# Patient Record
Sex: Male | Born: 1975 | Race: Asian | Hispanic: No | Marital: Married | Smoking: Never smoker
Health system: Southern US, Community
[De-identification: ages and names within clinical notes are randomized; demographics above are authoritative.]

## PROBLEM LIST (undated history)

## (undated) DIAGNOSIS — I10 Essential (primary) hypertension: Secondary | ICD-10-CM

## (undated) HISTORY — DX: Essential (primary) hypertension: I10

---

## 2012-06-24 ENCOUNTER — Ambulatory Visit: Payer: BC Managed Care – PPO

## 2012-06-24 ENCOUNTER — Ambulatory Visit (INDEPENDENT_AMBULATORY_CARE_PROVIDER_SITE_OTHER): Payer: BC Managed Care – PPO | Admitting: Emergency Medicine

## 2012-06-24 ENCOUNTER — Telehealth: Payer: Self-pay | Admitting: Radiology

## 2012-06-24 VITALS — BP 162/110 | HR 90 | Temp 98.4°F | Resp 17 | Ht 66.0 in | Wt 166.0 lb

## 2012-06-24 DIAGNOSIS — R042 Hemoptysis: Secondary | ICD-10-CM

## 2012-06-24 DIAGNOSIS — R05 Cough: Secondary | ICD-10-CM

## 2012-06-24 DIAGNOSIS — I10 Essential (primary) hypertension: Secondary | ICD-10-CM

## 2012-06-24 DIAGNOSIS — R918 Other nonspecific abnormal finding of lung field: Secondary | ICD-10-CM

## 2012-06-24 MED ORDER — LISINOPRIL 20 MG PO TABS: 20.0000 mg | ORAL_TABLET | Freq: Every day | ORAL | Status: DC

## 2012-06-24 NOTE — Patient Instructions (Addendum)
Hemoptysis  Hemoptysis means coughing up blood from some part of the respiratory tract. The respiratory tract includes the nose, mouth, throat, airway passages, and lungs. You should always contact a caregiver if you develop hemoptysis. This is important as even mild cases of hemoptysis may lead to serious breathing or bleeding problems. Major bleeding from the airway is considered a medical emergency, and needs to be evaluated and managed promptly to avoid complications, disability, or death. Hemoptysis may reoccur from time to time.  CAUSES   In some cases, the cause of hemoptysis is not known. Causes may include:   A ruptured blood vessel caused by coughing or an infection.   Bronchiectasis. Bronchiectasis is a long-standing infection of the small air passageways.   A blood clot in the lungs (pulmonary embolism).   Pneumonia.   Tuberculosis.   Breathing in a small foreign object.   Cancer.  DIAGNOSIS   Diagnosing the cause of hemoptysis is important. The most common cause of hemoptysis is a ruptured blood vessel caused by coughing or a mild infection. This is normally no cause for concern. Diagnosing the cause for hemoptysis is based on your history, symptoms, physical examination, and diagnostic tests. Tests for diagnosing the cause of hemoptysis may include:   Blood samples.   A chest X-ray.   A computerized X-ray scan (CT scan or CAT scan).   Bronchoscopy. This test uses a flexible tube (a bronchoscope) to see inside the lungs.   Pulmonary angiography. Pulmonary angiography is an X-ray procedure that looks at the vessels in the lungs. Angiography produces a picture called an angiogram. It requires injecting a dye into the blood vessels.  TREATMENT    Treatment for hemoptysis depends on the cause. It also depends on the quantity of blood. Infrequent, mild hemoptysis usually does not require specific, immediate treatment.   If the cause of hemoptysis is unknown, treatment may involve monitoring for  at least 2 or 3 years. If you have a normal chest X-ray and bronchoscopy, the hemoptysis usually clears within 6 months.   Treatment of a pneumonia, chronic bronchiectasis, or tuberculosis usually involves medicines to fight the infections.   For lung cancers, treatment depends on the stage of the cancer.   Major bleeding is a medical emergency. Steps are usually taken to find the source and stop the bleeding.   Some methods of controlling active moderate to severe bleeding include:   Surgical removal (resection). Tissue causing the hemoptysis is removed.   Bronchoscopic laser therapy. During a bronchoscopy, a laser is used to remove tumors and lesions or widen airways.   Bronchial artery embolization. This procedure involves injecting substances into the bloodstream to stop blood flow.  SEEK IMMEDIATE MEDICAL CARE IF:    You begin to cough up large amounts of blood.   You develop problems with your breathing.   You begin vomiting blood or see blood in your stool.   Develop chest pain.   Feel faint or pass out.   You develop a fever over 102 F (38.9 C), or as your caregiver suggests.  Document Released: 04/03/2001 Document Revised: 04/17/2011 Document Reviewed: 06/08/2009  [employer] Patient Information 2013 [employer].

## 2012-06-24 NOTE — Telephone Encounter (Signed)
Patient will need CT scan, called to advise. Spoke to sister, she will let him know.

## 2012-06-24 NOTE — Addendum Note (Signed)
Addended by: Carmelina Dane on: 06/24/2012 03:00 PM   Modules accepted: Orders

## 2012-06-24 NOTE — Progress Notes (Signed)
[facility] [address], [place] [address] [phone]  Date:  06/24/2012   Name:  Andre Webb   DOB:  [birth year]   MRN:  [id number]  PCP:  No primary provider on file.    Chief Complaint: Hemoptysis and Chest Pain   History of Present Illness:  Andre Webb is a 37 y.o. very pleasant male patient who presents with the following:  Had coughing spell this morning associated with some hard coughing and some hemoptysis.  No history of shortness of breath or wheezing. No coryza, nausea or vomiting.  No stool change or fever or chills. Treated while a child for months with a daily medication for "lung problem" that he thinks may have been tuberculosis.  Says that he has a radiographic scar since.  No improvement with over the counter medications or other home remedies. Denies other complaint or health concern today.   There are no active problems to display for this patient.   History reviewed. No pertinent past medical history.  History reviewed. No pertinent past surgical history.  History  Substance Use Topics  . Smoking status: Never Smoker   . Smokeless tobacco: Not on file  . Alcohol Use: No    History reviewed. No pertinent family history.  No Known Allergies  Medication list has been reviewed and updated.  No current outpatient prescriptions on file prior to visit.   No current facility-administered medications on file prior to visit.    Review of Systems:  As per HPI, otherwise negative.    Physical Examination: Filed Vitals:   06/24/12 1116  BP: 162/110  Pulse: 90  Temp: 98.4 F (36.9 C)  Resp: 17   Filed Vitals:   06/24/12 1116  Height: 5\' 6"  (1.676 m)  Weight: 166 lb (75.297 kg)   Body mass index is 26.81 kg/(m^2). Ideal Body Weight: Weight in (lb) to have BMI = 25: 154.6  GEN: WDWN, NAD, Non-toxic, A & O x 3 HEENT: Atraumatic, Normocephalic. Neck supple. No masses, No LAD. Ears and Nose: No external  deformity. CV: RRR, No M/G/R. No JVD. No thrill. No extra heart sounds. PULM: CTA B, no wheezes, crackles, rhonchi. No retractions. No resp. distress. No accessory muscle use. ABD: S, NT, ND, +BS. No rebound. No HSM. EXTR: No c/c/e NEURO Normal gait.  PSYCH: Normally interactive. Conversant. Not depressed or anxious appearing.  Calm demeanor.    Assessment and Plan: History of treated TB Cough and hemoptysis Await radiologist report   Signed,  Phillips Odor, MD   [facility] reading (PRIMARY) by  Dr. Dareen Piano.  negative.

## 2012-06-26 ENCOUNTER — Other Ambulatory Visit: Payer: Self-pay | Admitting: Physician Assistant

## 2012-06-26 ENCOUNTER — Encounter (HOSPITAL_COMMUNITY): Payer: Self-pay

## 2012-06-26 ENCOUNTER — Other Ambulatory Visit: Payer: Self-pay | Admitting: Emergency Medicine

## 2012-06-26 ENCOUNTER — Telehealth: Payer: Self-pay

## 2012-06-26 ENCOUNTER — Ambulatory Visit (HOSPITAL_COMMUNITY)
Admission: RE | Admit: 2012-06-26 | Discharge: 2012-06-26 | Disposition: A | Discharge status: Home / Self Care | Payer: BC Managed Care – PPO | Source: Ambulatory Visit | Attending: Emergency Medicine | Admitting: Emergency Medicine

## 2012-06-26 DIAGNOSIS — R042 Hemoptysis: Secondary | ICD-10-CM

## 2012-06-26 DIAGNOSIS — R918 Other nonspecific abnormal finding of lung field: Secondary | ICD-10-CM

## 2012-06-26 DIAGNOSIS — R911 Solitary pulmonary nodule: Secondary | ICD-10-CM

## 2012-06-26 DIAGNOSIS — R9389 Abnormal findings on diagnostic imaging of other specified body structures: Secondary | ICD-10-CM | POA: Insufficient documentation

## 2012-06-26 DIAGNOSIS — J8410 Pulmonary fibrosis, unspecified: Secondary | ICD-10-CM | POA: Insufficient documentation

## 2012-06-26 MED ORDER — IOHEXOL 300 MG/ML  SOLN
80.0000 mL | Freq: Once | INTRAMUSCULAR | Status: AC | PRN
  Administered 2012-06-26: 80 mL via INTRAVENOUS

## 2012-06-26 NOTE — Telephone Encounter (Signed)
[facility] called and advised that pt is there to have CT and w/pt's Sxs, the radiologist states that the best test to do is CT Chest w/contrast and they need [place] to change order in system if in agreement. Checked w/Heather who authorized to change per Radiologist advice. Dr Dareen Piano, sending this to you  FYI.

## 2012-07-02 ENCOUNTER — Telehealth: Payer: Self-pay

## 2012-07-02 NOTE — Telephone Encounter (Signed)
Pt is wanting to know results of CT Scan

## 2012-07-02 NOTE — Telephone Encounter (Signed)
Scarring right upper lobe, negative otherwise. Patient advised.

## 2012-12-04 ENCOUNTER — Ambulatory Visit (INDEPENDENT_AMBULATORY_CARE_PROVIDER_SITE_OTHER): Payer: BC Managed Care – PPO | Admitting: Family Medicine

## 2012-12-04 VITALS — BP 182/118 | HR 82 | Temp 98.9°F | Resp 16 | Ht 66.0 in | Wt 167.0 lb

## 2012-12-04 DIAGNOSIS — I10 Essential (primary) hypertension: Secondary | ICD-10-CM

## 2012-12-04 DIAGNOSIS — R079 Chest pain, unspecified: Secondary | ICD-10-CM

## 2012-12-04 LAB — POCT URINALYSIS DIPSTICK
Bilirubin, UA: NEGATIVE
Glucose, UA: NEGATIVE
Leukocytes, UA: NEGATIVE
Nitrite, UA: NEGATIVE
Urobilinogen, UA: 0.2

## 2012-12-04 LAB — POCT CBC
Granulocyte percent: 63.6 %G (ref 37–80)
HCT, POC: 48.1 % (ref 43.5–53.7)
Hemoglobin: 15.6 g/dL (ref 14.1–18.1)
Lymph, poc: 1.7 (ref 0.6–3.4)
MCHC: 32.4 g/dL (ref 31.8–35.4)
MCV: 94.1 fL (ref 80–97)
POC LYMPH PERCENT: 28.8 %L (ref 10–50)
RDW, POC: 13.3 %

## 2012-12-04 MED ORDER — METOPROLOL SUCCINATE ER 25 MG PO TB24: 25.0000 mg | ORAL_TABLET | Freq: Every day | ORAL | Status: DC

## 2012-12-04 MED ORDER — ASPIRIN EC 81 MG PO TBEC: 81.0000 mg | DELAYED_RELEASE_TABLET | Freq: Every day | ORAL | Status: AC

## 2012-12-04 MED ORDER — LISINOPRIL-HYDROCHLOROTHIAZIDE 20-25 MG PO TABS: 1.0000 | ORAL_TABLET | Freq: Every day | ORAL | Status: DC

## 2012-12-04 NOTE — Patient Instructions (Signed)

## 2012-12-04 NOTE — Progress Notes (Addendum)
This chart was scribed for Norberto Sorenson, MD by Andre Webb, ED Scribe. This patient was seen in room Room 8 and the patient's care was started at 4:53 PM  Subjective:    Patient ID: Andre Webb, male    DOB: 1975-07-01, 37 y.o.   MRN: [id number] Chief Complaint  Patient presents with   Hypertension    BP screen today was extremely high   Chest Pain    pressure, 1 day   Cough     HPI Andre Webb is a 37 y.o. male who presents to the [facility] complaining of HTN. Pt states for the past few weeks he has been having episodes of chest pain, described as tightness, that does not radiate, and occ accompanied by a vertigo-like dizziness or posterior HA. Often these symptoms last from about 15 min to a few hours. Pt was at a health fair at work today and states it was noted that his BP was markedly high so he was instructed to go see his dr immediately. Pt states the last time he had chest pain was 3 days ago - no CP currently. He had been attributing the CP to heavy lifting while at work. Pt states when he was seen here last for an acute illness w/ hemoptysis by Dr. Dareen Piano he was noted to have HTN so started on lisnopril but he only took it for 10 days. He stopped because he was feeling fine so didn't think he needed it. Pt denies diaphoresis and dyspnea, denies imbalance, denies vision changes. Pt is not fasting - last ate about 5 hrs prev.  Pt states when he is outside cutting the lawn, he generally feels chest tightness.  History reviewed. No pertinent past medical history.  Current Outpatient Prescriptions on File Prior to Visit  Medication Sig Dispense Refill   lisinopril (PRINIVIL,ZESTRIL) 20 MG tablet Take 1 tablet (20 mg total) by mouth daily.  30 tablet  3   No current facility-administered medications on file prior to visit.   No Known Allergies   Review of Systems  Constitutional: Negative for fever and chills.  Eyes: Negative for visual disturbance.  Respiratory:  Positive for chest tightness. Negative for apnea, cough and shortness of breath.   Cardiovascular: Positive for chest pain. Negative for leg swelling.  Gastrointestinal: Negative for abdominal pain.  Genitourinary: Negative for urgency, decreased urine volume and difficulty urinating.  Musculoskeletal: Negative for arthralgias, back pain and myalgias.  Skin: Negative for rash.  Neurological: Positive for dizziness, light-headedness and headaches. Negative for syncope, facial asymmetry and weakness.  Psychiatric/Behavioral: The patient is not nervous/anxious.     Triage Vitals: BP 182/118   Pulse 82   Temp(Src) 98.9 F (37.2 C)   Resp 16   Ht 5\' 6"  (1.676 m)   Wt 167 lb (75.751 kg)   BMI 26.97 kg/m2   SpO2 97% Objective:   Physical Exam  Nursing note and vitals reviewed. Constitutional: He is oriented to person, place, and time. He appears well-developed and well-nourished. No distress.  HENT:  Head: Normocephalic and atraumatic.  Right Ear: External ear normal.  Left Ear: External ear normal.  Eyes: Conjunctivae are normal. Pupils are equal, round, and reactive to light.  Neck: Normal range of motion. Neck supple. No tracheal deviation present. No thyromegaly present.  Cardiovascular: Normal rate, regular rhythm and normal heart sounds.  Exam reveals no gallop and no friction rub.   No murmur heard. Pulmonary/Chest: Effort normal and breath sounds normal. No respiratory  distress. He has no wheezes. He has no rales. He exhibits no tenderness.  Musculoskeletal: He exhibits no edema and no tenderness.  Lymphadenopathy:    He has no cervical adenopathy.  Neurological: He is alert and oriented to person, place, and time. No cranial nerve deficit. He exhibits normal muscle tone.  Skin: Skin is warm and dry. He is not diaphoretic.  Psychiatric: He has a normal mood and affect. His behavior is normal.    EKG: NSR, large T waves, T wave inversion in lead 3 and v1   5:41 PM-Discussed EKG  findings with pt.     Assessment & Plan:  Will refer pt to Cardiologist. Reccommended pt to start an Aspirin daily along with started prescribed anti-hypertensives. Advised pt if he has chest pain, to take 4 baby asa and if it persists beyond 5 minutes, call 911. Recheck in clinic in 4d.   Chest pain, unspecified - Plan: EKG 12-Lead, Ambulatory referral to Cardiology, POCT urinalysis dipstick, POCT CBC, TSH, Comprehensive metabolic panel, LDL cholesterol, direct, Lipid panel  Essential hypertension, malignant  Meds ordered this encounter  Medications   lisinopril-hydrochlorothiazide (PRINZIDE,ZESTORETIC) 20-25 MG per tablet    Sig: Take 1 tablet by mouth daily.    Dispense:  30 tablet    Refill:  0   metoprolol succinate (TOPROL-XL) 25 MG 24 hr tablet    Sig: Take 1 tablet (25 mg total) by mouth daily.    Dispense:  30 tablet    Refill:  0   aspirin EC 81 MG tablet    Sig: Take 1 tablet (81 mg total) by mouth daily.    Dispense:  90 tablet    Refill:  3    I personally performed the services described in this documentation, which was scribed in my presence. The recorded information has been reviewed and considered, and addended by me as needed.  Norberto Sorenson, MD MPH

## 2012-12-05 LAB — LDL CHOLESTEROL, DIRECT: Direct LDL: 107 mg/dL — ABNORMAL HIGH

## 2012-12-05 LAB — COMPREHENSIVE METABOLIC PANEL
ALT: 26 U/L (ref 0–53)
CO2: 29 mEq/L (ref 19–32)
Calcium: 9.9 mg/dL (ref 8.4–10.5)
Chloride: 102 mEq/L (ref 96–112)
Glucose, Bld: 91 mg/dL (ref 70–99)
Sodium: 139 mEq/L (ref 135–145)
Total Bilirubin: 0.6 mg/dL (ref 0.3–1.2)
Total Protein: 7.4 g/dL (ref 6.0–8.3)

## 2012-12-05 LAB — LIPID PANEL
Cholesterol: 155 mg/dL (ref 0–200)
Total CHOL/HDL Ratio: 4.3 Ratio

## 2012-12-08 ENCOUNTER — Ambulatory Visit (INDEPENDENT_AMBULATORY_CARE_PROVIDER_SITE_OTHER): Payer: BC Managed Care – PPO | Admitting: Family Medicine

## 2012-12-08 VITALS — BP 141/95 | HR 76 | Temp 98.0°F | Resp 17 | Ht 67.0 in | Wt 168.0 lb

## 2012-12-08 DIAGNOSIS — Z79899 Other long term (current) drug therapy: Secondary | ICD-10-CM

## 2012-12-08 DIAGNOSIS — I10 Essential (primary) hypertension: Secondary | ICD-10-CM

## 2012-12-08 LAB — BASIC METABOLIC PANEL
BUN: 17 mg/dL (ref 6–23)
CO2: 30 mEq/L (ref 19–32)
Chloride: 99 mEq/L (ref 96–112)
Glucose, Bld: 102 mg/dL — ABNORMAL HIGH (ref 70–99)
Potassium: 3.9 mEq/L (ref 3.5–5.3)
Sodium: 136 mEq/L (ref 135–145)

## 2012-12-08 MED ORDER — LOSARTAN POTASSIUM-HCTZ 100-25 MG PO TABS: 1.0000 | ORAL_TABLET | Freq: Every day | ORAL | Status: DC

## 2012-12-08 MED ORDER — METOPROLOL SUCCINATE ER 50 MG PO TB24: 50.0000 mg | ORAL_TABLET | Freq: Every day | ORAL | Status: DC

## 2012-12-08 MED ORDER — AMLODIPINE BESYLATE 5 MG PO TABS: 5.0000 mg | ORAL_TABLET | Freq: Every day | ORAL | Status: DC

## 2012-12-08 NOTE — Patient Instructions (Signed)
We will double your metoprolol - a higher dose has been sent to your pharmacy but for now just take 2 tabs of your Toprol XL 25mg  until you run out - then when you get the next rx from your pharmacy it will be at 50mg  instead. We will change your lisinopril-hctz to losartan-hctz as hopefully it will not cause the dry cough you are experiencing. If you want to complete your current bottle of lisinopril-hctz before switching to the losartan, that is fine. We will start you on amlodipine 5mg  daily. Lets recheck your blood pressure in clinic in about 1 month or you can schedule an appointment to see me on a Friday at our [facility] clinic next door - just call [phone] to schedule.  Come back sooner if you are having any symptoms - headaches, lightheadedness, vision changes, chest pain, swelling in your legs, shortness of breath, or anything else new or concerning.  Managing Your High Blood Pressure Blood pressure is a measurement of how forceful your blood is pressing against the walls of the arteries. Arteries are muscular tubes within the circulatory system. Blood pressure does not stay the same. Blood pressure rises when you are active, excited, or nervous; and it lowers during sleep and relaxation. If the numbers measuring your blood pressure stay above normal most of the time, you are at risk for health problems. High blood pressure (hypertension) is a long-term (chronic) condition in which blood pressure is elevated. A blood pressure reading is recorded as two numbers, such as 120 over 80 (or 120/80). The first, higher number is called the systolic pressure. It is a measure of the pressure in your arteries as the heart beats. The second, lower number is called the diastolic pressure. It is a measure of the pressure in your arteries as the heart relaxes between beats.  Keeping your blood pressure in a normal range is important to your overall health and prevention of health problems, such as heart disease and  stroke. When your blood pressure is uncontrolled, your heart has to work harder than normal. High blood pressure is a very common condition in adults because blood pressure tends to rise with age. Men and women are equally likely to have hypertension but at different times in life. Before age 9, men are more likely to have hypertension. After 39 years of age, women are more likely to have it. Hypertension is especially common in African Americans. This condition often has no signs or symptoms. The cause of the condition is usually not known. Your caregiver can help you come up with a plan to keep your blood pressure in a normal, healthy range. BLOOD PRESSURE STAGES Blood pressure is classified into four stages: normal, prehypertension, stage 1, and stage 2. Your blood pressure reading will be used to determine what type of treatment, if any, is necessary. Appropriate treatment options are tied to these four stages:  Normal  Systolic pressure (mm Hg): below 120.  Diastolic pressure (mm Hg): below 80. Prehypertension  Systolic pressure (mm Hg): 120 to 139.  Diastolic pressure (mm Hg): 80 to 89. Stage1  Systolic pressure (mm Hg): 140 to 159.  Diastolic pressure (mm Hg): 90 to 99. Stage2  Systolic pressure (mm Hg): 160 or above.  Diastolic pressure (mm Hg): 100 or above. RISKS RELATED TO HIGH BLOOD PRESSURE Managing your blood pressure is an important responsibility. Uncontrolled high blood pressure can lead to:  A heart attack.  A stroke.  A weakened blood vessel (aneurysm).  Heart  failure.  Kidney damage.  Eye damage.  Metabolic syndrome.  Memory and concentration problems. HOW TO MANAGE YOUR BLOOD PRESSURE Blood pressure can be managed effectively with lifestyle changes and medicines (if needed). Your caregiver will help you come up with a plan to bring your blood pressure within a normal range. Your plan should include the following: Education  Read all information  provided by your caregivers about how to control blood pressure.  Educate yourself on the latest guidelines and treatment recommendations. New research is always being done to further define the risks and treatments for high blood pressure. Lifestylechanges  Control your weight.  Avoid smoking.  Stay physically active.  Reduce the amount of salt in your diet.  Reduce stress.  Control any chronic conditions, such as high cholesterol or diabetes.  Reduce your alcohol intake. Medicines  Several medicines (antihypertensive medicines) are available, if needed, to bring blood pressure within a normal range. Communication  Review all the medicines you take with your caregiver because there may be side effects or interactions.  Talk with your caregiver about your diet, exercise habits, and other lifestyle factors that may be contributing to high blood pressure.  See your caregiver regularly. Your caregiver can help you create and adjust your plan for managing high blood pressure. RECOMMENDATIONS FOR TREATMENT AND FOLLOW-UP  The following recommendations are based on current guidelines for managing high blood pressure in nonpregnant adults. Use these recommendations to identify the proper follow-up period or treatment option based on your blood pressure reading. You can discuss these options with your caregiver.  Systolic pressure of 120 to 139 or diastolic pressure of 80 to 89: Follow up with your caregiver as directed.  Systolic pressure of 140 to 160 or diastolic pressure of 90 to 100: Follow up with your caregiver within 2 months.  Systolic pressure above 160 or diastolic pressure above 100: Follow up with your caregiver within 1 month.  Systolic pressure above 180 or diastolic pressure above 110: Consider antihypertensive therapy; follow up with your caregiver within 1 week.  Systolic pressure above 200 or diastolic pressure above 120: Begin antihypertensive therapy; follow up  with your caregiver within 1 week. Document Released: 10/18/2011 Document Reviewed: 10/18/2011 [facility] Patient Information 2014 [place], [place].

## 2012-12-08 NOTE — Progress Notes (Signed)
This chart was scribed for Norberto Sorenson, MD by Ardelia Mems, Scribe. This patient was seen in room 4 and the patient's care was started at 12:10 PM.  Subjective:    Patient ID: Andre Webb, male    DOB: September 07, 1975, 37 y.o.   MRN: [id number]  Chief Complaint  Patient presents with  . Follow-up    HBP    HPI  HPI Comments: Andre Webb is a 37 y.o. male who presents to [facility] for a follow-up on his BP. Pt was seen here 3 days ago after having episodes of chest pain, described as "pressure" and high blood pressure noted at a health screen. He was started on toprol xl 25, lisinopril-hctz 20-25, and asa 81 qd. He states that his chest pain has improved since his last visit and that he has only had a few rare mild episodes in the past 3 days. He states that he is having intermittent momentary light-headedness today. He states that this sensation is not a "room spinning" sensation. He states that episodes of dizziness usually last only a few seconds, but sometimes longer and seems to be asstd w/ position change though hard to say. He states that he has had a mild cough at night in the past 3 days. He states that he has been drinking plenty of water. He states that he is taking all of the medications he was started on after his last visit without any concerning side effects, other than possibly his cough. He denies visual disturbances leg swelling, difficulty breathing or muscle cramps.  History reviewed. No pertinent past medical history.  Current Outpatient Prescriptions on File Prior to Visit  Medication Sig Dispense Refill  . aspirin EC 81 MG tablet Take 1 tablet (81 mg total) by mouth daily.  90 tablet  3   No current facility-administered medications on file prior to visit.   No Known Allergies   Review of Systems  Constitutional: Negative for fever and chills.  Eyes: Negative for visual disturbance.  Respiratory: Positive for cough. Negative for shortness of breath  and wheezing.   Cardiovascular: Positive for chest pain (improving). Negative for leg swelling.  Neurological: Positive for dizziness and light-headedness. Negative for syncope, facial asymmetry, weakness and headaches.      BP 141/95  Pulse 76  Temp(Src) 98 F (36.7 C) (Oral)  Resp 17  Ht 5\' 7"  (1.702 m)  Wt 168 lb (76.204 kg)  BMI 26.31 kg/m2  SpO2 96% Objective:   Physical Exam  Nursing note and vitals reviewed. Constitutional: He is oriented to person, place, and time. He appears well-developed and well-nourished. No distress.  HENT:  Head: Normocephalic and atraumatic.  Eyes: EOM are normal.  Neck: Neck supple. No tracheal deviation present.  Cardiovascular: Normal rate, regular rhythm and normal heart sounds.   No murmur heard. Pulmonary/Chest: Effort normal and breath sounds normal. No respiratory distress. He has no wheezes. He has no rales.  Musculoskeletal: Normal range of motion.  Neurological: He is alert and oriented to person, place, and time.  Skin: Skin is warm and dry.  Psychiatric: He has a normal mood and affect. His behavior is normal.      orthostatics negative: BP 132-141/89-97 with P 72-76 Assessment & Plan:   Essential hypertension, malignant  Encounter for long-term (current) use of other medications - Plan: Basic metabolic panel Increased toprol from 25 to 50. Add in amlodipine 5 now.  After current bottle of lisinopril-hctz 20-25 is finished (  4 more wks) then switch to losartan-hctz 100-25 due to cough - unless cough is bothering him more, than can switch sooner.  Recheck in 1 mo - sched appt w/ me on 12/5. Will f/u on cardiology referral for poss stress test. Meds ordered this encounter  Medications  . metoprolol succinate (TOPROL-XL) 50 MG 24 hr tablet    Sig: Take 1 tablet (50 mg total) by mouth daily.    Dispense:  30 tablet    Refill:  0  . losartan-hydrochlorothiazide (HYZAAR) 100-25 MG per tablet    Sig: Take 1 tablet by mouth daily.     Dispense:  30 tablet    Refill:  0  . amLODipine (NORVASC) 5 MG tablet    Sig: Take 1 tablet (5 mg total) by mouth daily.    Dispense:  30 tablet    Refill:  1    I personally performed the services described in this documentation, which was scribed in my presence. The recorded information has been reviewed and considered, and addended by me as needed.  Norberto Sorenson, MD MPH

## 2012-12-09 ENCOUNTER — Encounter: Payer: Self-pay | Admitting: Family Medicine

## 2012-12-20 ENCOUNTER — Encounter: Payer: Self-pay | Admitting: Cardiovascular Disease

## 2012-12-20 ENCOUNTER — Ambulatory Visit (INDEPENDENT_AMBULATORY_CARE_PROVIDER_SITE_OTHER): Payer: BC Managed Care – PPO | Admitting: Cardiovascular Disease

## 2012-12-20 VITALS — BP 158/78 | HR 84 | Resp 20 | Ht 66.0 in | Wt 169.0 lb

## 2012-12-20 DIAGNOSIS — I10 Essential (primary) hypertension: Secondary | ICD-10-CM

## 2012-12-20 DIAGNOSIS — R0789 Other chest pain: Secondary | ICD-10-CM

## 2012-12-20 NOTE — Patient Instructions (Signed)
Your physician recommends that you schedule a follow-up appointment in: as needed  

## 2012-12-22 DIAGNOSIS — I10 Essential (primary) hypertension: Secondary | ICD-10-CM | POA: Insufficient documentation

## 2012-12-22 NOTE — Progress Notes (Signed)
Patient ID: Andre Webb, male   DOB: December 18, 1975, 37 y.o.   MRN: [id number]     Reason for office visit [name] was diagnosed with asymptomatic systemic hypertension and a recent employer mandated exam. One point he complained of a little chest tightness but this was never severe. He has a fairly physical job and is able to do it without any limitations. His and hypertensive medications have been slowly titrated and his blood pressure control is now greatly improved. He is monitoring his blood pressure at home and a typical reading will be 116-132/70s. Today when he first checked and his blood pressure is 150/78 when I rechecked it was 130/84. He is now completely asymptomatic. He was referred to cardiology since he needs so many antihypertensive medications.   Allergies  Allergen Reactions  . Lisinopril     Cough     Current Outpatient Prescriptions  Medication Sig Dispense Refill  . amLODipine (NORVASC) 5 MG tablet Take 1 tablet (5 mg total) by mouth daily.  30 tablet  1  . aspirin EC 81 MG tablet Take 1 tablet (81 mg total) by mouth daily.  90 tablet  3  . losartan-hydrochlorothiazide (HYZAAR) 100-25 MG per tablet Take 1 tablet by mouth daily.  30 tablet  0  . metoprolol succinate (TOPROL-XL) 50 MG 24 hr tablet Take 1 tablet (50 mg total) by mouth daily.  30 tablet  0   No current facility-administered medications for this visit.    No past medical probelms.  No past surgery.  No family history of attention or other inheritable disorders.  History   Social History  . Marital Status: Married    Spouse Name: N/A    Number of Children: N/A  . Years of Education: N/A   Occupational History  . Not on file.   Social History Main Topics  . Smoking status: Never Smoker   . Smokeless tobacco: Not on file  . Alcohol Use: No  . Drug Use: No  . Sexual Activity: No   Other Topics Concern  . Not on file   Social History Narrative  . No narrative on file    Review of  systems: The patient specifically denies any chest pain at rest or with exertion, dyspnea at rest or with exertion, orthopnea, paroxysmal nocturnal dyspnea, syncope, palpitations, focal neurological deficits, intermittent claudication, lower extremity edema, unexplained weight gain, cough, hemoptysis or wheezing.  The patient also denies abdominal pain, nausea, vomiting, dysphagia, diarrhea, constipation, polyuria, polydipsia, dysuria, hematuria, frequency, urgency, abnormal bleeding or bruising, fever, chills, unexpected weight changes, mood swings, change in skin or hair texture, change in voice quality, auditory or visual problems, allergic reactions or rashes, new musculoskeletal complaints other than usual "aches and pains".   PHYSICAL EXAM BP 158/78  Pulse 84  Resp 20  Ht 5\' 6"  (1.676 m)  Wt 169 lb (76.658 kg)  BMI 27.29 kg/m2  General: Alert, oriented x3, no distress Head: no evidence of trauma, PERRL, EOMI, no exophtalmos or lid lag, no myxedema, no xanthelasma; normal ears, nose and oropharynx Neck: normal jugular venous pulsations and no hepatojugular reflux; brisk carotid pulses without delay and no carotid bruits Chest: clear to auscultation, no signs of consolidation by percussion or palpation, normal fremitus, symmetrical and full respiratory excursions Cardiovascular: normal position and quality of the apical impulse, regular rhythm, normal first and second heart sounds, no murmurs, rubs or gallops Abdomen: no tenderness or distention, no masses by palpation, no abnormal pulsatility or arterial  bruits, normal bowel sounds, no hepatosplenomegaly Extremities: no clubbing, cyanosis or edema; 2+ radial, ulnar and brachial pulses bilaterally; 2+ right femoral, posterior tibial and dorsalis pedis pulses; 2+ left femoral, posterior tibial and dorsalis pedis pulses; no subclavian or femoral bruits There is no evidence of radial femoral delay Neurological: grossly nonfocal   EKG:  NSR  Lipid Panel     Component Value Date/Time   CHOL 155 12/04/2012 1733   TRIG 178* 12/04/2012 1733   HDL 36* 12/04/2012 1733   CHOLHDL 4.3 12/04/2012 1733   VLDL 36 12/04/2012 1733   LDLCALC 83 12/04/2012 1733    BMET    Component Value Date/Time   NA 136 12/08/2012 1227   K 3.9 12/08/2012 1227   CL 99 12/08/2012 1227   CO2 30 12/08/2012 1227   GLUCOSE 102* 12/08/2012 1227   BUN 17 12/08/2012 1227   CREATININE 0.80 12/08/2012 1227   CALCIUM 10.1 12/08/2012 1227     ASSESSMENT AND PLAN Although [name] requires multiple antihypertensive medications and does not have a family history of hypertension, the most likely explanation for his high blood pressure remains essential hypertension. I think his physicians have done an excellent job of finding a successful and the rational regimen of antihypertensive medications. He is tolerating these without any side effects. I do not think any adjustment is necessary. A history and physical exam there does not appear to be any reason to suspect a secondary cause of hypertension. He has normal renal function. He is at low risk of developing renal artery stenosis. There is no evidence of congenital cardiac or vascular abnormalities exam. His electrocardiogram does not show evidence of significant left ventricular hypertrophy and if he continues to take appropriate hypertensive medications his outcome is likely to be good. It is also important that he maintain healthy weight, restrict sodium in his diet and remain physically active. Orders Placed This Encounter  Procedures  . EKG 12-Lead   Patient Instructions  Your physician recommends that you schedule a follow-up appointment in: as needed      [facility]  Thurmon Fair, MD, [facility] [phone] office [phone] pager

## 2012-12-24 ENCOUNTER — Encounter: Payer: Self-pay | Admitting: Cardiovascular Disease

## 2013-01-06 ENCOUNTER — Other Ambulatory Visit: Payer: Self-pay | Admitting: Family Medicine

## 2013-01-10 ENCOUNTER — Encounter: Payer: Self-pay | Admitting: Family Medicine

## 2013-01-10 ENCOUNTER — Ambulatory Visit (INDEPENDENT_AMBULATORY_CARE_PROVIDER_SITE_OTHER): Payer: BC Managed Care – PPO | Admitting: Family Medicine

## 2013-01-10 VITALS — BP 100/64 | HR 62 | Temp 99.1°F | Resp 16 | Ht 65.75 in | Wt 165.4 lb

## 2013-01-10 DIAGNOSIS — Z79899 Other long term (current) drug therapy: Secondary | ICD-10-CM

## 2013-01-10 DIAGNOSIS — I10 Essential (primary) hypertension: Secondary | ICD-10-CM

## 2013-01-10 MED ORDER — CHLORTHALIDONE 100 MG PO TABS: 100.0000 mg | ORAL_TABLET | Freq: Every day | ORAL | Status: DC

## 2013-01-10 MED ORDER — AMLODIPINE BESYLATE 10 MG PO TABS: ORAL_TABLET | ORAL | Status: DC

## 2013-01-10 MED ORDER — CHLORTHALIDONE 25 MG PO TABS: 25.0000 mg | ORAL_TABLET | Freq: Every day | ORAL | Status: DC

## 2013-01-10 NOTE — Progress Notes (Signed)
Subjective:    Patient ID: Andre Webb, male    DOB: Jan 05, 1976, 37 y.o.   MRN: [id number] Chief Complaint  Patient presents with  . Hypertension    pt states he still gets lt.-headed/cough at night    HPI  If he gets to up to quickly, walks to quickly, or is standing for more than 45 minutes he gets lightheaded.  He had a similar sensation when he was put on lisinopril 20mg  by Dr Dareen Piano earlier this year.  He checks his BP at home and has been running in the 110s/70s.  When he goes to work in the morning - people have asked him if he is on drugs as he does take his BP pills altogether in the morning and then tends to be a little out of it.  He definitely started feeling worse with more symptoms after we increased his toprol from 25 to 50 at last visit. No lower ext edema. No chest tightness or pains. Is still coughing at night - cough will keep him up to 1-2 a.m.  No change in cough when he switched from the lisinopril to the losartan.  No past medical history on file. Current Outpatient Prescriptions on File Prior to Visit  Medication Sig Dispense Refill  . aspirin EC 81 MG tablet Take 1 tablet (81 mg total) by mouth daily.  90 tablet  3   No current facility-administered medications on file prior to visit.   Allergies  Allergen Reactions  . Lisinopril     Cough     Review of Systems  Constitutional: Negative for fever and chills.  Eyes: Negative for visual disturbance.  Respiratory: Positive for cough. Negative for chest tightness and shortness of breath.   Cardiovascular: Negative for chest pain, palpitations and leg swelling.  Neurological: Positive for dizziness and light-headedness. Negative for syncope, facial asymmetry, weakness and headaches.  Psychiatric/Behavioral: Positive for confusion and decreased concentration. Negative for sleep disturbance and dysphoric mood. The patient is not nervous/anxious.       BP 100/64  Pulse 62  Temp(Src) 99.1 F (37.3 C)  (Oral)  Resp 16  Ht 5' 5.75" (1.67 m)  Wt 165 lb 6.4 oz (75.025 kg)  BMI 26.90 kg/m2  SpO2 98% Objective:   Physical Exam  Constitutional: He is oriented to person, place, and time. He appears well-developed and well-nourished. No distress.  HENT:  Head: Normocephalic and atraumatic.  Eyes: Conjunctivae are normal. Pupils are equal, round, and reactive to light. No scleral icterus.  Neck: Normal range of motion. Neck supple. No thyromegaly present.  Cardiovascular: Normal rate, regular rhythm, normal heart sounds and intact distal pulses.   Pulmonary/Chest: Effort normal and breath sounds normal. No respiratory distress.  Musculoskeletal: He exhibits no edema.  Lymphadenopathy:    He has no cervical adenopathy.  Neurological: He is alert and oriented to person, place, and time.  Skin: Skin is warm and dry. He is not diaphoretic.  Psychiatric: He has a normal mood and affect. His behavior is normal.          Assessment & Plan:  HTN - I suspect a lot of his lightheadedness is coming from his toprol as well as just his BP being so low - big change for his body. Stop toprol.  Also had cough from both lisinopril and losartan so d/c losartan-hctz, try chlorthalidone instead - gave info on high potassium diet - and increase norvasc from 5 to 10.  Recheck in 2 wks. Meds  ordered this encounter  Medications  . DISCONTD: chlorthalidone (HYGROTEN) 100 MG tablet    Sig: Take 1 tablet (100 mg total) by mouth daily.    Dispense:  30 tablet    Refill:  1  . amLODipine (NORVASC) 10 MG tablet    Sig: TAKE 1 TABLET BY MOUTH EVERY DAY    Dispense:  30 tablet    Refill:  1  . chlorthalidone (HYGROTON) 25 MG tablet    Sig: Take 1 tablet (25 mg total) by mouth daily.    Dispense:  30 tablet    Refill:  1    Norberto Sorenson, MD MPH

## 2013-01-10 NOTE — Patient Instructions (Signed)
Potassium Content of Foods Potassium is a mineral found in many foods and drinks. It helps keep fluids and minerals balanced in your body and also affects how steadily your heart beats. The body needs potassium to control blood pressure and to keep the muscles and nervous system healthy. However, certain health conditions and medicine may require you to eat more or less potassium-rich foods and drinks. Your caregiver or dietitian will tell you how much potassium you should have each day. COMMON SERVING SIZES The list below tells you how big or small common portion sizes are:  1 oz.........4 stacked dice.  3 oz.........Deck of cards.  1 tsp........Tip of little finger.  1 tbsp......Thumb.  2 tbsp......Golf ball.   c...........Half of a fist.  1 c............A fist. FOODS AND DRINKS HIGH IN POTASSIUM More than 200 mg of potassium per serving. A serving size is  c (120 mL or noted gram weight) unless otherwise stated. While all the items on this list are high in potassium, some items are higher in potassium than others. Fruits  Apricots (sliced), 83 g.  Apricots (dried halves), 3 oz / 24 g.  Avocado (cubed),  c / 50 g.  Banana (sliced), 75 g.  Cantaloupe (cubed), 80 g.  Dates (pitted), 5 whole / 35 g.  Figs (dried), 4 whole / 32 g.  Guava, c / 55 g.  Honeydew, 1 wedge / 85 g.  Kiwi (sliced), 90 g.  Nectarine, 1 small / 129 g.  Orange, 1 medium / 131 g.  Orange juice.  Pomegranate seeds, 87 g.  Pomegranate juice.  Prunes (pitted), 3 whole / 30 g.  Prune juice, 3 oz / 90 mL.  Seedless raisins, 3 tbsp / 27 g. Vegetables  Artichoke,  of a medium / 64 g.  Asparagus (boiled), 90 g.  Baked beans,  c / 63 g.  Bamboo shoots,  c / 38 g.  Beets (cooked slices), 85 g.  Broccoli (boiled), 78 g.  Brussels sprout (boiled), 78 g.  Butternut squash (baked), 103 g.  Chickpea (cooked), 82 g.  Green peas (cooked), 80 g.  Hubbard squash (baked cubes),  c /  68 g.  Kidney beans (cooked), 5 tbsp / 55 g.  Lima beans (cooked),  c / 43 g.  Navy beans (cooked),  c / 61 g.  Potato (baked), 61 g.  Potato (boiled), 78 g.  Pumpkin (boiled), 123 g.  Refried beans,  c / 79 g.  Spinach (cooked),  c / 45 g.  Split peas (cooked),  c / 65 g.  Sun-dried tomatoes, 2 tbsp / 7 g.  Sweet potato (baked),  c / 50 g.  Tomato (chopped or sliced), 90 g.  Tomato juice.  Tomato paste, 4 tsp / 21 g.  Tomato sauce,  c / 61 g.  Vegetable juice.  White mushrooms (cooked), 78 g.  Yam (cooked or baked),  c / 34 g.  Zucchini squash (boiled), 90 g. Other Foods and Drinks  Almonds (whole),  c / 36 g.  Cashews (oil roasted),  c / 32 g.  Chocolate milk.  Chocolate pudding, 142 g.  Clams (steamed), 1.5 oz / 43 g.  Dark chocolate, 1.5 oz / 42 g.  Fish, 3 oz / 85 g.  King crab (steamed), 3 oz / 85 g.  Lobster (steamed), 4 oz / 113 g.  Milk (skim, 1%, 2%, whole), 1 c / 240 mL.  Milk chocolate, 2.3 oz / 66 g.  Milk shake.  Nonfat fruit   variety yogurt, 123 g.  Peanuts (oil roasted), 1 oz / 28 g.  Peanut butter, 2 tbsp / 32 g.  Pistachio nuts, 1 oz / 28 g.  Pumpkin seeds, 1 oz / 28 g.  Red meat (broiled, cooked, grilled), 3 oz / 85 g.  Scallops (steamed), 3 oz / 85 g.  Shredded wheat cereal (dry), 3 oblong biscuits / 75 g.  Spaghetti sauce,  c / 66 g.  Sunflower seeds (dry roasted), 1 oz / 28 g.  Veggie burger, 1 patty / 70 g. FOODS MODERATE IN POTASSIUM Between 150 mg and 200 mg per serving. A serving is  c (120 mL or noted gram weight) unless otherwise stated. Fruits  Grapefruit,  of the fruit / 123 g.  Grapefruit juice.  Pineapple juice.  Plums (sliced), 83 g.  Tangerine, 1 large / 120 g. Vegetables  Carrots (boiled), 78 g.  Carrots (sliced), 61 g.  Rhubarb (cooked with sugar), 120 g.  Rutabaga (cooked), 120 g.  Sweet corn (cooked), 75 g.  Yellow snap beans (cooked), 63 g. Other Foods and  Drinks   Bagel, 1 bagel / 98 g.  Chicken breast (roasted and chopped),  c / 70 g.  Chocolate ice cream / 66 g.  Pita bread, 1 large / 64 g.  Shrimp (steamed), 4 oz / 113 g.  Swiss cheese (diced), 70 g.  Vanilla ice cream, 66 g.  Vanilla pudding, 140 g. FOODS LOW IN POTASSIUM Less than 150 mg per serving. A serving size is  cup (120 mL or noted gram weight) unless otherwise stated. If you eat more than 1 serving of a food low in potassium, the food may be considered a food high in potassium. Fruits  Apple (slices), 55 g.  Apple juice.  Applesauce, 122 g.  Blackberries, 72 g.  Blueberries, 74 g.  Cranberries, 50 g.  Cranberry juice.  Fruit cocktail, 119 g.  Fruit punch.  Grapes, 46 g.  Grape juice.  Mandarin oranges (canned), 126 g.  Peach (slices), 77 g.  Pineapple (chunks), 83 g.  Raspberries, 62 g.  Red cherries (without pits), 78 g.  Strawberries (sliced), 83 g.  Watermelon (diced), 76 g. Vegetables  Alfalfa sprouts, 17 g.  Bell peppers (sliced), 46 g.  Cabbage (shredded), 35 g.  Cauliflower (boiled), 62 g.  Celery, 51 g.  Collard greens (boiled), 95 g.  Cucumber (sliced), 52 g.  Eggplant (cubed), 41 g.  Green beans (boiled), 63 g.  Lettuce (shredded), 1 c / 36 g.  Onions (sauteed), 44 g.  Radishes (sliced), 58 g.  Spaghetti squash, 51 g. Other Foods and Drinks  Angel food cake, 1 slice / 28 g.  Black tea.  Brown rice (cooked), 98 g.  Butter croissant, 1 medium / 57 g.  Carbonated soda.  Coffee.  Cheddar cheese (diced), 66 g.  Corn flake cereal (dry), 14 g.  Cottage cheese, 118 g.  Cream of rice cereal (cooked), 122 g.  Cream of wheat cereal (cooked), 126 g.  Crisped rice cereal (dry), 14 g.  Egg (boiled, fried, poached, omelet, scrambled), 1 large / 46 61 g.  English muffin, 1 muffin / 57 g.  Frozen ice pop, 1 pop / 55 g.  Graham cracker, 1 large rectangular cracker / 14 g.  Jelly beans, 112  g.  Non-dairy whipped topping.  Oatmeal, 88 g.  Orange sherbet, 74 g.  Puffed rice cereal (dry), 7 g.  Pasta (cooked), 70 g.  Rice cakes, 4 cakes / 36   g.  Sugared doughnut, 4 oz / 116 g.  White bread, 1 slice / 30 g.  White rice (cooked), 79 93 g.  Wild rice (cooked), 82 g.  Yellow cake, 1 slice / 68 g. Document Released: 09/06/2004 Document Revised: 01/10/2012 Document Reviewed: 06/09/2011 [employer] Patient Information 2014 [employer].  

## 2013-01-22 ENCOUNTER — Ambulatory Visit (INDEPENDENT_AMBULATORY_CARE_PROVIDER_SITE_OTHER): Payer: BC Managed Care – PPO | Admitting: Family Medicine

## 2013-01-22 VITALS — BP 142/100 | HR 76 | Temp 97.5°F | Resp 16 | Ht 65.5 in | Wt 168.0 lb

## 2013-01-22 DIAGNOSIS — I10 Essential (primary) hypertension: Secondary | ICD-10-CM

## 2013-01-22 DIAGNOSIS — Z79899 Other long term (current) drug therapy: Secondary | ICD-10-CM

## 2013-01-22 MED ORDER — ATENOLOL 25 MG PO TABS: 25.0000 mg | ORAL_TABLET | Freq: Every day | ORAL | Status: DC

## 2013-01-22 MED ORDER — HYDROCHLOROTHIAZIDE 25 MG PO TABS: 25.0000 mg | ORAL_TABLET | Freq: Every day | ORAL | Status: DC

## 2013-01-22 NOTE — Progress Notes (Signed)
Subjective:    Patient ID: Andre Webb, male    DOB: Dec 17, 1975, 37 y.o.   MRN: [id number] This chart was scribed for Andre Mocha, MD by Valera Castle, ED Scribe. This patient was seen in room 9 and the patient's care was started at 5:44 PM.  Chief Complaint  Patient presents with  . Hypertension    Discuss medications    HPI Andre Webb is a 37 y.o. male  Pt has high blood pressure. 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 Saw him 2 weeks ago. Pt was having light headedness which he thought came from over-treatement of blood pressure, so stopped Toprol 50 mg. Was also having cough on both Lisinopril and so we discontinued Losartan HT CZ and put him on Chlorthalidone, and increased his Amlodipine.   He reports taking the Chlorthalidone, but states he felt terrible after the second day. He reports palpitations and chest pain. He wants to check his blood pressure again. He reports highs of 148-152. He states he quit the Chlorthalidone and recently he has been taking Amlodipine 10 mg, Aspirin, and added back in his Toprol, and states his symptoms have improved. He denies any swelling in his bilateral LE. He reports his light-headedness has come back a little bit since starting back on the Toprol. He reports his cough has subsided.   He denies any other complaints.   PCP - No PCP Per Patient  Patient Active Problem List   Diagnosis Date Noted  . HTN (hypertension) 12/22/2012   History reviewed. No pertinent past medical history. History reviewed. No pertinent past surgical history. Allergies  Allergen Reactions  . Lisinopril     Cough    Prior to Admission medications   Medication Sig Start Date End Date Taking? Authorizing Provider  amLODipine (NORVASC) 10 MG tablet TAKE 1 TABLET BY MOUTH EVERY DAY 01/10/13  Yes Andre Mocha, MD  aspirin EC 81 MG tablet Take 1 tablet (81 mg total) by mouth daily. 12/04/12  Yes Andre Mocha, MD  metoprolol succinate (TOPROL-XL) 50 MG 24 hr  tablet Take 50 mg by mouth daily. Take with or immediately following a meal.   Yes Historical Provider, MD  chlorthalidone (HYGROTON) 25 MG tablet Take 1 tablet (25 mg total) by mouth daily. 01/10/13   Andre Mocha, MD   History reviewed. No pertinent family history. History   Social History  . Marital Status: Married    Spouse Name: N/A    Number of Children: N/A  . Years of Education: N/A   Occupational History  . Not on file.   Social History Main Topics  . Smoking status: Never Smoker   . Smokeless tobacco: Not on file  . Alcohol Use: No  . Drug Use: No  . Sexual Activity: No   Other Topics Concern  . Not on file   Social History Narrative  . No narrative on file    Review of Systems  Constitutional: Negative for fever and chills.  Eyes: Negative for visual disturbance.  Respiratory: Negative for cough and shortness of breath.   Cardiovascular: Positive for chest pain and palpitations. Negative for leg swelling.  Neurological: Positive for light-headedness. Negative for dizziness, syncope, facial asymmetry, weakness and headaches.    BP 142/100  Pulse 76  Temp(Src) 97.5 F (36.4 C) (Oral)  Resp 16  Ht 5' 5.5" (1.664 m)  Wt 168 lb (76.204 kg)  BMI 27.52 kg/m2  SpO2 99% Objective:   Physical Exam  Nursing note and vitals reviewed. Constitutional: He is oriented to person, place, and time. He appears well-developed and well-nourished. No distress.  HENT:  Head: Normocephalic and atraumatic.  Eyes: EOM are normal.  Neck: Neck supple. No tracheal deviation present.  Cardiovascular: Normal rate, regular rhythm and normal heart sounds.  Exam reveals no gallop and no friction rub.   No murmur heard. Pulmonary/Chest: Effort normal and breath sounds normal. No respiratory distress. He has no wheezes. He has no rales.  Musculoskeletal: Normal range of motion.  Neurological: He is alert and oriented to person, place, and time.  Skin: Skin is warm and dry.    Psychiatric: He has a normal mood and affect. His behavior is normal.        Assessment & Plan:   Essential hypertension, malignant - Pt w/ cough w/ losartan and lisionpril. Intolerant of chlorthalidone (made him feel terrible) and toprol (lightheaded/dizzy).  Doing well on amlodipine so continue but BP still to high.  Try switching from Toprol to atenolol - hopefully less lightheadedness w/ that. Try adding back in hctz - may tolerate better than chlorthalidone as not as strong and tried it in ace/arb combo prev.  Encounter for long-term (current) use of other medications  Meds ordered this encounter  Medications  . DISCONTD: metoprolol succinate (TOPROL-XL) 50 MG 24 hr tablet    Sig: Take 50 mg by mouth daily. Take with or immediately following a meal.  . hydrochlorothiazide (HYDRODIURIL) 25 MG tablet    Sig: Take 1 tablet (25 mg total) by mouth daily.    Dispense:  30 tablet    Refill:  1  . atenolol (TENORMIN) 25 MG tablet    Sig: Take 1 tablet (25 mg total) by mouth daily.    Dispense:  30 tablet    Refill:  1    I personally performed the services described in this documentation, which was scribed in my presence. The recorded information has been reviewed and considered, and addended by me as needed.  Andre Sorenson, MD MPH

## 2013-01-22 NOTE — Patient Instructions (Signed)
Managing Your High Blood Pressure Blood pressure is a measurement of how forceful your blood is pressing against the walls of the arteries. Arteries are muscular tubes within the circulatory system. Blood pressure does not stay the same. Blood pressure rises when you are active, excited, or nervous; and it lowers during sleep and relaxation. If the numbers measuring your blood pressure stay above normal most of the time, you are at risk for health problems. High blood pressure (hypertension) is a long-term (chronic) condition in which blood pressure is elevated. A blood pressure reading is recorded as two numbers, such as 120 over 80 (or 120/80). The first, higher number is called the systolic pressure. It is a measure of the pressure in your arteries as the heart beats. The second, lower number is called the diastolic pressure. It is a measure of the pressure in your arteries as the heart relaxes between beats.  Keeping your blood pressure in a normal range is important to your overall health and prevention of health problems, such as heart disease and stroke. When your blood pressure is uncontrolled, your heart has to work harder than normal. High blood pressure is a very common condition in adults because blood pressure tends to rise with age. Men and women are equally likely to have hypertension but at different times in life. Before age 45, men are more likely to have hypertension. After 39 years of age, women are more likely to have it. Hypertension is especially common in African Americans. This condition often has no signs or symptoms. The cause of the condition is usually not known. Your caregiver can help you come up with a plan to keep your blood pressure in a normal, healthy range. BLOOD PRESSURE STAGES Blood pressure is classified into four stages: normal, prehypertension, stage 1, and stage 2. Your blood pressure reading will be used to determine what type of treatment, if any, is necessary.  Appropriate treatment options are tied to these four stages:  Normal  Systolic pressure (mm Hg): below 120.  Diastolic pressure (mm Hg): below 80. Prehypertension  Systolic pressure (mm Hg): 120 to 139.  Diastolic pressure (mm Hg): 80 to 89. Stage1  Systolic pressure (mm Hg): 140 to 159.  Diastolic pressure (mm Hg): 90 to 99. Stage2  Systolic pressure (mm Hg): 160 or above.  Diastolic pressure (mm Hg): 100 or above. RISKS RELATED TO HIGH BLOOD PRESSURE Managing your blood pressure is an important responsibility. Uncontrolled high blood pressure can lead to:  A heart attack.  A stroke.  A weakened blood vessel (aneurysm).  Heart failure.  Kidney damage.  Eye damage.  Metabolic syndrome.  Memory and concentration problems. HOW TO MANAGE YOUR BLOOD PRESSURE Blood pressure can be managed effectively with lifestyle changes and medicines (if needed). Your caregiver will help you come up with a plan to bring your blood pressure within a normal range. Your plan should include the following: Education  Read all information provided by your caregivers about how to control blood pressure.  Educate yourself on the latest guidelines and treatment recommendations. New research is always being done to further define the risks and treatments for high blood pressure. Lifestylechanges  Control your weight.  Avoid smoking.  Stay physically active.  Reduce the amount of salt in your diet.  Reduce stress.  Control any chronic conditions, such as high cholesterol or diabetes.  Reduce your alcohol intake. Medicines  Several medicines (antihypertensive medicines) are available, if needed, to bring blood pressure within a normal range.   Reduce stress.   Control any chronic conditions, such as high cholesterol or diabetes.   Reduce your alcohol intake.  Medicines   Several medicines (antihypertensive medicines) are available, if needed, to bring blood pressure within a normal range.  Communication   Review all the medicines you take with your caregiver because there may be side effects or interactions.   Talk with your caregiver about your diet, exercise habits, and other lifestyle factors that may be contributing to  high blood pressure.   See your caregiver regularly. Your caregiver can help you create and adjust your plan for managing high blood pressure.  RECOMMENDATIONS FOR TREATMENT AND FOLLOW-UP   The following recommendations are based on current guidelines for managing high blood pressure in nonpregnant adults. Use these recommendations to identify the proper follow-up period or treatment option based on your blood pressure reading. You can discuss these options with your caregiver.   Systolic pressure of 120 to 139 or diastolic pressure of 80 to 89: Follow up with your caregiver as directed.   Systolic pressure of 140 to 160 or diastolic pressure of 90 to 100: Follow up with your caregiver within 2 months.   Systolic pressure above 160 or diastolic pressure above 100: Follow up with your caregiver within 1 month.   Systolic pressure above 180 or diastolic pressure above 110: Consider antihypertensive therapy; follow up with your caregiver within 1 week.   Systolic pressure above 200 or diastolic pressure above 120: Begin antihypertensive therapy; follow up with your caregiver within 1 week.  Document Released: 10/18/2011 Document Reviewed: 10/18/2011  [employer] Patient Information 2014 [employer].

## 2013-01-28 ENCOUNTER — Other Ambulatory Visit: Payer: Self-pay | Admitting: Family Medicine

## 2013-02-14 ENCOUNTER — Ambulatory Visit (INDEPENDENT_AMBULATORY_CARE_PROVIDER_SITE_OTHER): Payer: BC Managed Care – PPO | Admitting: Family Medicine

## 2013-02-14 ENCOUNTER — Encounter: Payer: Self-pay | Admitting: Family Medicine

## 2013-02-14 VITALS — BP 130/89 | HR 66 | Temp 98.4°F | Resp 16 | Ht 65.5 in | Wt 166.6 lb

## 2013-02-14 DIAGNOSIS — Z79899 Other long term (current) drug therapy: Secondary | ICD-10-CM

## 2013-02-14 DIAGNOSIS — R079 Chest pain, unspecified: Secondary | ICD-10-CM

## 2013-02-14 DIAGNOSIS — I10 Essential (primary) hypertension: Secondary | ICD-10-CM

## 2013-02-14 DIAGNOSIS — I951 Orthostatic hypotension: Secondary | ICD-10-CM

## 2013-02-14 LAB — BASIC METABOLIC PANEL
BUN: 11 mg/dL (ref 6–23)
CHLORIDE: 97 meq/L (ref 96–112)
CO2: 28 mEq/L (ref 19–32)
Calcium: 10.3 mg/dL (ref 8.4–10.5)
Creat: 0.68 mg/dL (ref 0.50–1.35)
Glucose, Bld: 91 mg/dL (ref 70–99)
POTASSIUM: 3.5 meq/L (ref 3.5–5.3)
Sodium: 136 mEq/L (ref 135–145)

## 2013-02-14 MED ORDER — HYDROCHLOROTHIAZIDE 25 MG PO TABS: 25.0000 mg | ORAL_TABLET | Freq: Every day | ORAL | Status: DC

## 2013-02-14 MED ORDER — ATENOLOL 25 MG PO TABS: 25.0000 mg | ORAL_TABLET | Freq: Every day | ORAL | Status: DC

## 2013-02-14 MED ORDER — AMLODIPINE BESYLATE 10 MG PO TABS: ORAL_TABLET | ORAL | Status: DC

## 2013-02-14 NOTE — Progress Notes (Signed)
Subjective:    Patient ID: Andre Webb, male    DOB: 11-03-1975, 38 y.o.   MRN: [id number] Chief Complaint  Patient presents with  . Follow-up    BP    HPI  Checking BP outside of office and doing much better on current regimen.  BP often running 124-138/75-87.  Sig less side effects on current med regimen.  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 Occ when he twists certain ways, he gets left chest pain - cramping, lasts for just a few seconds  Occ getting exercise - will walk for about 30 min  No past medical history on file. Current Outpatient Prescriptions on File Prior to Visit  Medication Sig Dispense Refill  . aspirin EC 81 MG tablet Take 1 tablet (81 mg total) by mouth daily.  90 tablet  3   No current facility-administered medications on file prior to visit.   Allergies  Allergen Reactions  . Lisinopril     Cough    Review of Systems  Constitutional: Negative for fever and chills.  Eyes: Negative for visual disturbance.  Respiratory: Positive for chest tightness. Negative for cough, shortness of breath and wheezing.   Cardiovascular: Positive for chest pain. Negative for palpitations and leg swelling.  Neurological: Positive for dizziness and light-headedness. Negative for syncope, facial asymmetry, weakness and headaches.      BP 130/89  Pulse 66  Temp(Src) 98.4 F (36.9 C) (Oral)  Resp 16  Ht 5' 5.5" (1.664 m)  Wt 166 lb 9.6 oz (75.569 kg)  BMI 27.29 kg/m2  SpO2 98% Objective:   Physical Exam  Constitutional: He is oriented to person, place, and time. He appears well-developed and well-nourished. No distress.  HENT:  Head: Normocephalic and atraumatic.  Eyes: Conjunctivae are normal. Pupils are equal, round, and reactive to light. No scleral icterus.  Neck: Normal range of motion. Neck supple. No thyromegaly present.  Cardiovascular: Normal rate,  regular rhythm, normal heart sounds and intact distal pulses.   Pulmonary/Chest: Effort normal and breath sounds normal. No respiratory distress.  Musculoskeletal: He exhibits no edema.  Lymphadenopathy:    He has no cervical adenopathy.  Neurological: He is alert and oriented to person, place, and time.  Skin: Skin is warm and dry. He is not diaphoretic.  Psychiatric: He has a normal mood and affect. His behavior is normal.         negative orthostatics on exam today Assessment & Plan:   Hypertension - much improved on current regimen - still some minimal side effects but much less than w/ other meds we have tried and much better BP control so cont current meds for now. Cont to monitor bp at home 1-2x/wk.  Orthostatic hypotension  Chest pain, unspecified - seen by cardiology for this prior who did not think it was cardiac nor was stress testing indicated. Baseline nml ekg reviewed. If cont, rec consider repeat cards eval.  Encounter for long-term (current) use of other medications - Plan: Basic metabolic panel  F/u in 6 mos for CPE - fasting.  Meds ordered this encounter  Medications  . hydrochlorothiazide (HYDRODIURIL) 25 MG tablet    Sig: Take 1 tablet (25 mg total) by mouth daily.    Dispense:  90 tablet    Refill:  1  . atenolol (TENORMIN) 25 MG tablet    Sig:  Take 1 tablet (25 mg total) by mouth daily.    Dispense:  90 tablet    Refill:  1  . amLODipine (NORVASC) 10 MG tablet    Sig: TAKE 1 TABLET BY MOUTH EVERY DAY    Dispense:  90 tablet    Refill:  1    Norberto SorensonEva Shaw, MD MPH

## 2013-02-14 NOTE — Patient Instructions (Addendum)
Come back in 6 mos for a full physical - you should take your medications with water that morning but otherwise be FASTING for labs (nothing to eat or drink for at least 8 hrs before hand). Keep monitoring your blood pressure 1-2 x/wk and record.   Systolic pressure of 120 to 139 or diastolic pressure of 80 to 89: Follow up with me in 6 mos.  Systolic pressure of 140 to 160 or diastolic pressure of 90 to 100: Follow up with me within 1 months.  Systolic pressure above 160 or diastolic pressure above 100: Follow up with your caregiver within 1 week.  Systolic pressure above 180 or diastolic pressure above 110: Come to clinic immediately for evaluation.  Systolic pressure above 200 or diastolic pressure above 120: Go to ER immediately for evaluation and treatment - consider calling 911 if you feel abnormal at all (chest pain, headache, vision change, lightheaded, etc).  Orthostatic Hypotension Orthostatic hypotension is a sudden fall in blood pressure. It occurs when a person goes from a sitting or lying position to a standing position. CAUSES   Loss of body fluids (dehydration).  Medicines that lower blood pressure.  Sudden changes in posture, such as sudden standing when you have been sitting or lying down.  Taking too much of your medicine. SYMPTOMS   Lightheadedness or dizziness.  Fainting or near-fainting.  A fast heart rate (tachycardia).  Weakness.  Feeling tired (fatigue). DIAGNOSIS  Your caregiver may find the cause of orthostatic hypotension through:  A history and/or physical exam.  Checking your blood pressure. Your caregiver will check your blood pressure when you are:  Lying down.  Sitting.  Standing.  Tilt table testing. In this test, you are placed on a table that goes from a lying position to a standing position. You will be strapped to the table. This test helps to monitor your blood pressure and heart rate when you are in different  positions. TREATMENT   If orthostatic hypotension is caused by your medicines, your caregiver will need to adjust your dosage. Do not stop or adjust your medicine on your own.  When changing positions, make these changes slowly. This allows your body to adjust to the different position.  Compression stockings that are worn on your lower legs may be helpful.  Your caregiver may have you consume extra salt. Do not add extra salt to your diet unless directed by your caregiver.  Eat frequent, small meals. Avoid sudden standing after eating.  Avoid hot showers or excessive heat.  Your caregiver may give you fluids through the vein (intravenous).  Your caregiver may put you on medicine to help enhance fluid retention. SEEK IMMEDIATE MEDICAL CARE IF:   You faint or have a near-fainting episode. Call your local emergency services (911 in [place]  You have or develop chest pain.  You feel sick to your stomach (nauseous) or vomit.  You have a loss of feeling or movement in your arms or legs.  You have difficulty talking, slurred speech, or you are unable to talk.  You have difficulty thinking or have confused thinking. MAKE SURE YOU:   Understand these instructions.  Will watch your condition.  Will get help right away if you are not doing well or get worse. Document Released: 01/13/2002 Document Revised: 04/17/2011 Document Reviewed: 05/08/2008 [facility] Patient Information 2014 [place], [place]. Managing Your High Blood Pressure Blood pressure is a measurement of how forceful your blood is pressing against the walls of the arteries. Arteries are  muscular tubes within the circulatory system. Blood pressure does not stay the same. Blood pressure rises when you are active, excited, or nervous; and it lowers during sleep and relaxation. If the numbers measuring your blood pressure stay above normal most of the time, you are at risk for health problems. High blood pressure (hypertension) is  a long-term (chronic) condition in which blood pressure is elevated. A blood pressure reading is recorded as two numbers, such as 120 over 80 (or 120/80). The first, higher number is called the systolic pressure. It is a measure of the pressure in your arteries as the heart beats. The second, lower number is called the diastolic pressure. It is a measure of the pressure in your arteries as the heart relaxes between beats.  Keeping your blood pressure in a normal range is important to your overall health and prevention of health problems, such as heart disease and stroke. When your blood pressure is uncontrolled, your heart has to work harder than normal. High blood pressure is a very common condition in adults because blood pressure tends to rise with age. Men and women are equally likely to have hypertension but at different times in life. Before age 55, men are more likely to have hypertension. After 40 years of age, women are more likely to have it. Hypertension is especially common in African Americans. This condition often has no signs or symptoms. The cause of the condition is usually not known. Your caregiver can help you come up with a plan to keep your blood pressure in a normal, healthy range. BLOOD PRESSURE STAGES Blood pressure is classified into four stages: normal, prehypertension, stage 1, and stage 2. Your blood pressure reading will be used to determine what type of treatment, if any, is necessary. Appropriate treatment options are tied to these four stages:  Normal  Systolic pressure (mm Hg): below 120.  Diastolic pressure (mm Hg): below 80. Prehypertension  Systolic pressure (mm Hg): 120 to 139.  Diastolic pressure (mm Hg): 80 to 89. Stage1  Systolic pressure (mm Hg): 140 to 159.  Diastolic pressure (mm Hg): 90 to 99. Stage2  Systolic pressure (mm Hg): 160 or above.  Diastolic pressure (mm Hg): 100 or above. RISKS RELATED TO HIGH BLOOD PRESSURE Managing your blood  pressure is an important responsibility. Uncontrolled high blood pressure can lead to:  A heart attack.  A stroke.  A weakened blood vessel (aneurysm).  Heart failure.  Kidney damage.  Eye damage.  Metabolic syndrome.  Memory and concentration problems. HOW TO MANAGE YOUR BLOOD PRESSURE Blood pressure can be managed effectively with lifestyle changes and medicines (if needed). Your caregiver will help you come up with a plan to bring your blood pressure within a normal range. Your plan should include the following: Education  Read all information provided by your caregivers about how to control blood pressure.  Educate yourself on the latest guidelines and treatment recommendations. New research is always being done to further define the risks and treatments for high blood pressure. Lifestylechanges  Control your weight.  Avoid smoking.  Stay physically active.  Reduce the amount of salt in your diet.  Reduce stress.  Control any chronic conditions, such as high cholesterol or diabetes.  Reduce your alcohol intake. Medicines  Several medicines (antihypertensive medicines) are available, if needed, to bring blood pressure within a normal range. Communication  Review all the medicines you take with your caregiver because there may be side effects or interactions.  Talk with your caregiver  about your diet, exercise habits, and other lifestyle factors that may be contributing to high blood pressure.  See your caregiver regularly. Your caregiver can help you create and adjust your plan for managing high blood pressure. RECOMMENDATIONS FOR TREATMENT AND FOLLOW-UP  The following recommendations are based on current guidelines for managing high blood pressure in nonpregnant adults. Use these recommendations to identify the proper follow-up period or treatment option based on your blood pressure reading. You can discuss these options with your caregiver.  Systolic pressure  of 120 to 139 or diastolic pressure of 80 to 89: Follow up with your caregiver as directed.  Systolic pressure of 140 to 160 or diastolic pressure of 90 to 100: Follow up with your caregiver within 2 months.  Systolic pressure above 160 or diastolic pressure above 100: Follow up with your caregiver within 1 month.  Systolic pressure above 180 or diastolic pressure above 110: Consider antihypertensive therapy; follow up with your caregiver within 1 week.  Systolic pressure above 200 or diastolic pressure above 120: Begin antihypertensive therapy; follow up with your caregiver within 1 week. Document Released: 10/18/2011 Document Reviewed: 10/18/2011  [facility] Patient Information 2014 [place], [place].

## 2013-02-24 ENCOUNTER — Encounter: Payer: Self-pay | Admitting: Family Medicine

## 2013-06-01 IMAGING — CT CT CHEST W/ CM
2 of 3 series · 15 of 36 positions shown, 18 images · IV contrast (omnipaque)
Comparison: Chest radiograph 06/24/2012.

CLINICAL DATA: TB as a child.  Hemoptysis with abnormal chest
radiograph.

CT CHEST WITH CONTRAST
TECHNIQUE: Multidetector CT imaging of the chest was performed
following the standard protocol during bolus administration of
intravenous contrast.
Contrast:  80 ml [id number].

[Series 2: chest with · axial · 0.68mm/px · z∈[-99,+151]mm · 12 of 60 slices shown, 15 images]
[im 5/60  mediastinal]
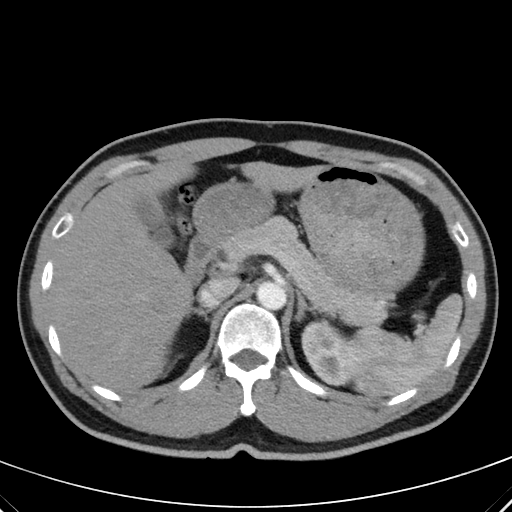
[im 5/60  lung]
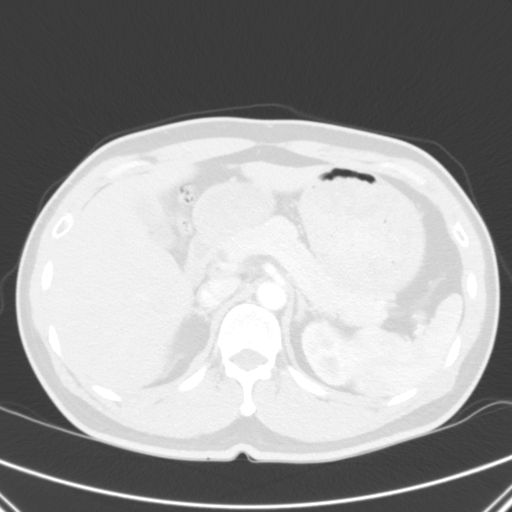
[im 9/60  lung]
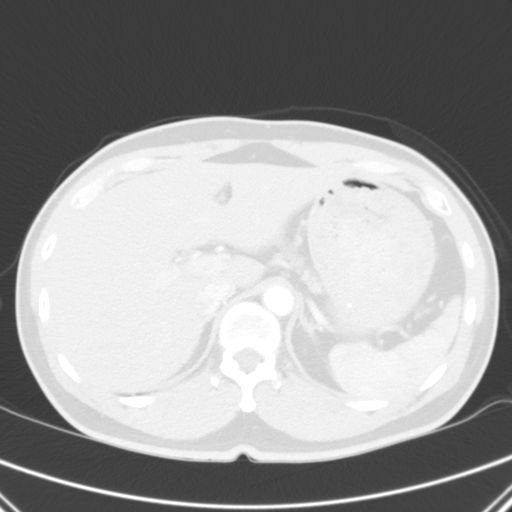
[im 14/60  lung]
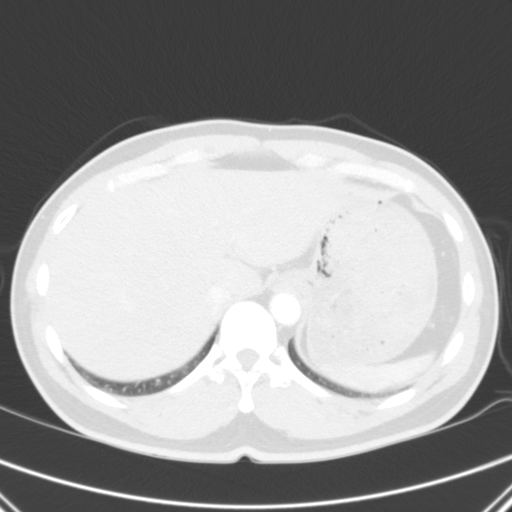
[im 18/60  lung]
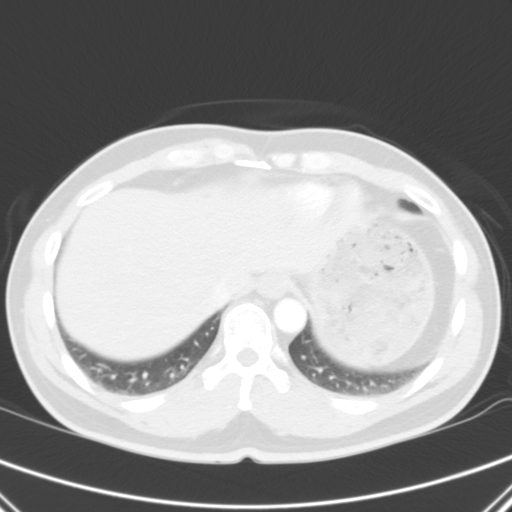
[im 22/60  mediastinal]
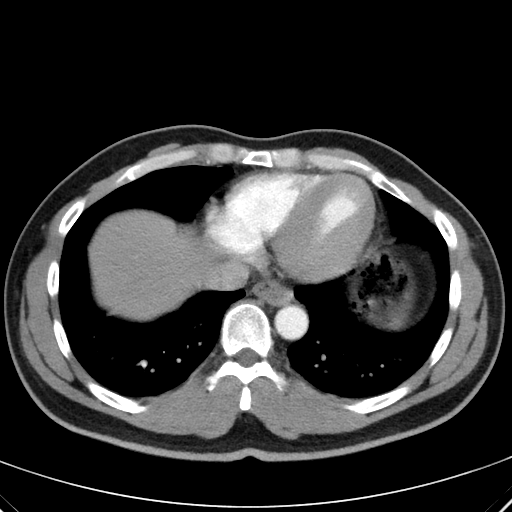
[im 22/60  lung]
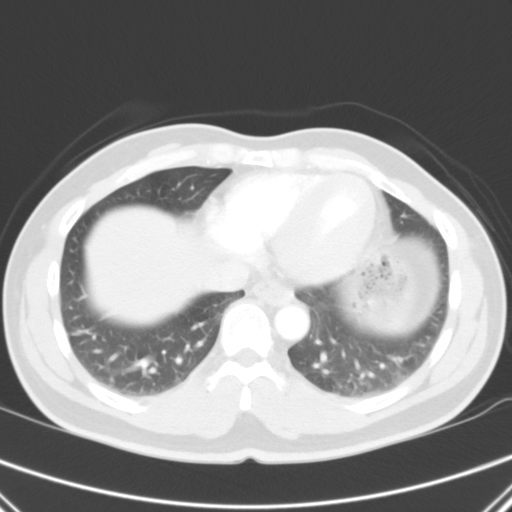
[im 27/60  lung]
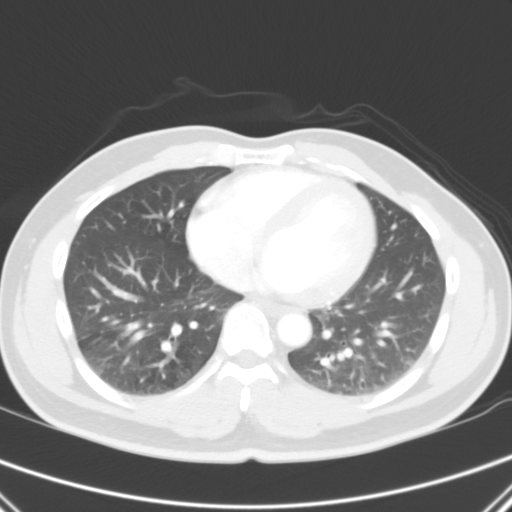
[im 33/60  lung]
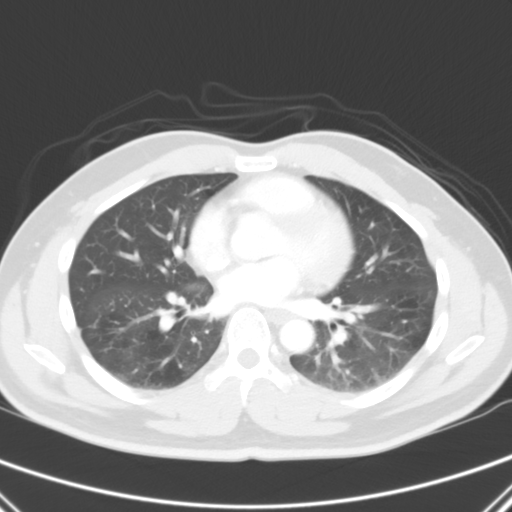
[im 38/60  lung]
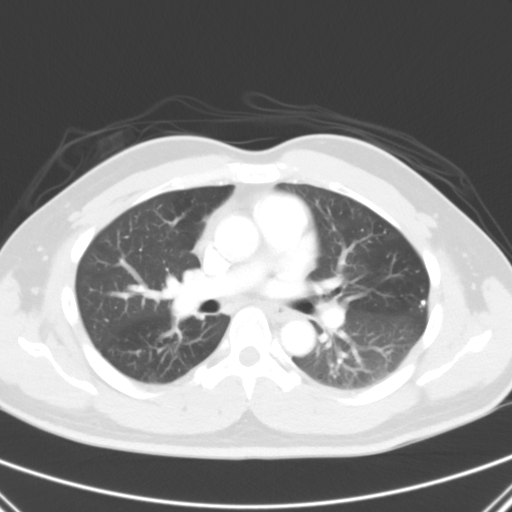
[im 42/60  mediastinal]
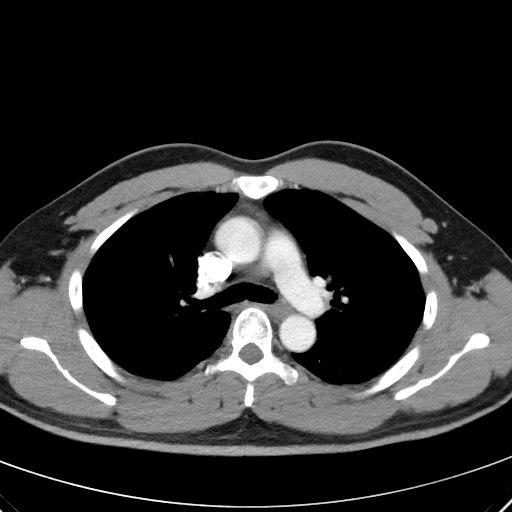
[im 42/60  lung]
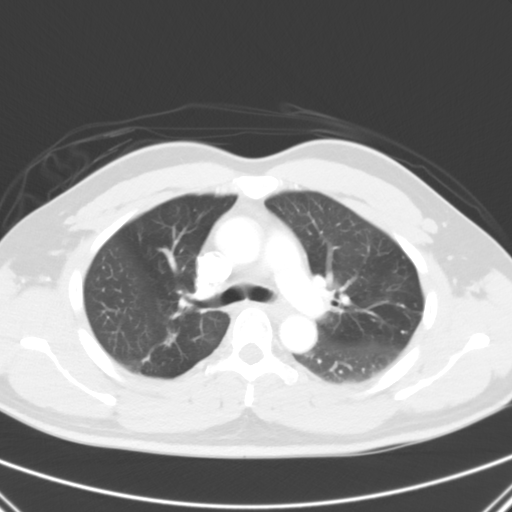
[im 46/60  lung]
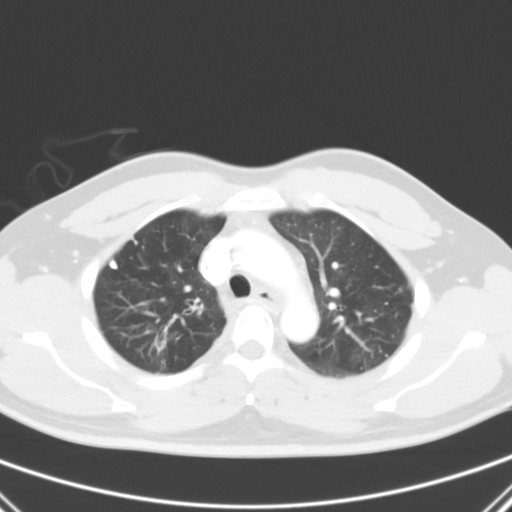
[im 51/60  lung]
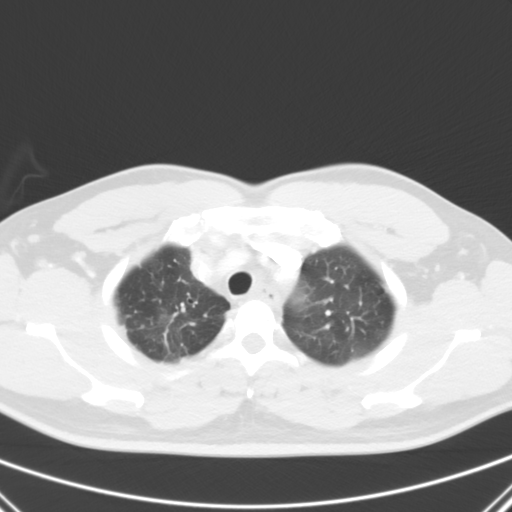
[im 55/60  lung]
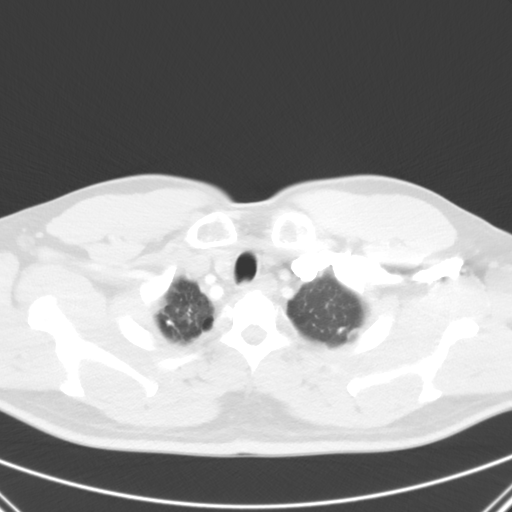

[coronal · coronal · 0.68mm/px · 3 of 92 slices shown]
[im 19/92  lung]
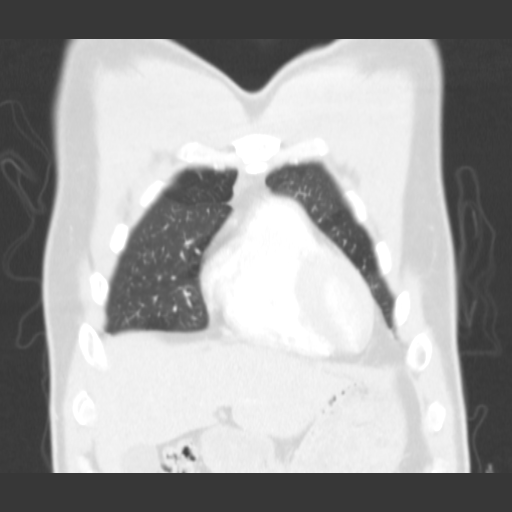
[im 37/92  lung]
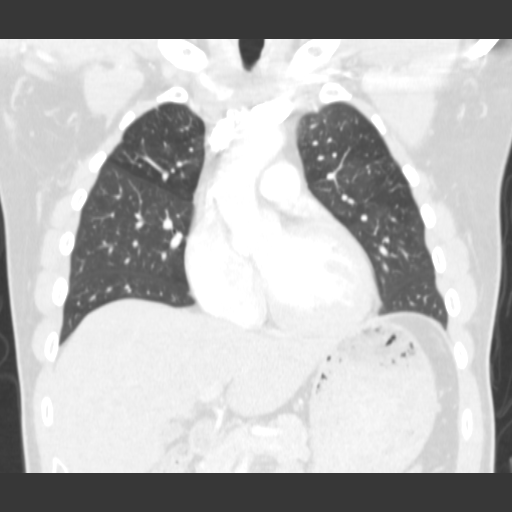
[im 55/92  lung]
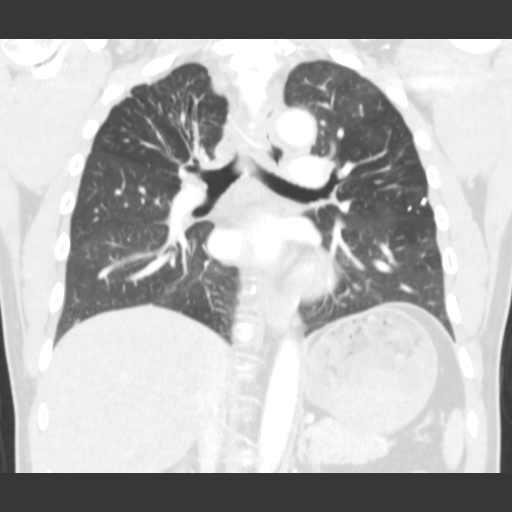

[15 of 36 positions shown; findings below may reference images not displayed]

FINDINGS: No pathologically enlarged mediastinal, hilar or axillary
lymph nodes.  Heart size normal.  No pericardial effusion.

Multiple scattered pulmonary parenchymal calcified granulomas.
Scattered areas of architectural distortion, most prominent in the
apical and posterior segments of the right upper lobe.  No cavitary
lesions.  No airspace consolidation or pleural fluid.  Airway is
unremarkable.

Incidental imaging of the upper abdomen shows no acute findings.
No worrisome lytic or sclerotic lesions.
IMPRESSION: Old granulomas disease with scarring in the right upper lobe.  No
evidence of active tuberculosis.

## 2013-08-15 ENCOUNTER — Encounter: Payer: Self-pay | Admitting: Family Medicine

## 2013-08-15 ENCOUNTER — Ambulatory Visit (INDEPENDENT_AMBULATORY_CARE_PROVIDER_SITE_OTHER): Payer: BC Managed Care – PPO | Admitting: Family Medicine

## 2013-08-15 VITALS — BP 130/92 | HR 73 | Temp 98.2°F | Resp 16 | Ht 65.75 in | Wt 172.6 lb

## 2013-08-15 DIAGNOSIS — H6520 Chronic serous otitis media, unspecified ear: Secondary | ICD-10-CM

## 2013-08-15 DIAGNOSIS — I10 Essential (primary) hypertension: Secondary | ICD-10-CM

## 2013-08-15 DIAGNOSIS — H6523 Chronic serous otitis media, bilateral: Secondary | ICD-10-CM

## 2013-08-15 DIAGNOSIS — Z23 Encounter for immunization: Secondary | ICD-10-CM

## 2013-08-15 DIAGNOSIS — Z0000 Encounter for general adult medical examination without abnormal findings: Secondary | ICD-10-CM

## 2013-08-15 DIAGNOSIS — Z79899 Other long term (current) drug therapy: Secondary | ICD-10-CM

## 2013-08-15 LAB — COMPREHENSIVE METABOLIC PANEL
ALBUMIN: 4.7 g/dL (ref 3.5–5.2)
ALK PHOS: 40 U/L (ref 39–117)
ALT: 41 U/L (ref 0–53)
AST: 25 U/L (ref 0–37)
BILIRUBIN TOTAL: 0.6 mg/dL (ref 0.2–1.2)
BUN: 13 mg/dL (ref 6–23)
CO2: 27 mEq/L (ref 19–32)
Calcium: 9.3 mg/dL (ref 8.4–10.5)
Chloride: 103 mEq/L (ref 96–112)
Creat: 0.74 mg/dL (ref 0.50–1.35)
Glucose, Bld: 99 mg/dL (ref 70–99)
POTASSIUM: 3.8 meq/L (ref 3.5–5.3)
Sodium: 141 mEq/L (ref 135–145)
Total Protein: 7.5 g/dL (ref 6.0–8.3)

## 2013-08-15 LAB — CBC WITH DIFFERENTIAL/PLATELET
BASOS ABS: 0 10*3/uL (ref 0.0–0.1)
BASOS PCT: 0 % (ref 0–1)
Eosinophils Absolute: 0.2 10*3/uL (ref 0.0–0.7)
Eosinophils Relative: 3 % (ref 0–5)
HCT: 47 % (ref 39.0–52.0)
Hemoglobin: 16.9 g/dL (ref 13.0–17.0)
Lymphocytes Relative: 31 % (ref 12–46)
Lymphs Abs: 1.9 10*3/uL (ref 0.7–4.0)
MCH: 29.9 pg (ref 26.0–34.0)
MCHC: 36 g/dL (ref 30.0–36.0)
MCV: 83 fL (ref 78.0–100.0)
MONO ABS: 0.8 10*3/uL (ref 0.1–1.0)
Monocytes Relative: 13 % — ABNORMAL HIGH (ref 3–12)
NEUTROS ABS: 3.2 10*3/uL (ref 1.7–7.7)
NEUTROS PCT: 53 % (ref 43–77)
Platelets: 287 10*3/uL (ref 150–400)
RBC: 5.66 MIL/uL (ref 4.22–5.81)
RDW: 13.1 % (ref 11.5–15.5)
WBC: 6 10*3/uL (ref 4.0–10.5)

## 2013-08-15 LAB — POCT URINALYSIS DIPSTICK
Bilirubin, UA: NEGATIVE
Glucose, UA: NEGATIVE
KETONES UA: NEGATIVE
Leukocytes, UA: NEGATIVE
Nitrite, UA: NEGATIVE
PH UA: 7.5
PROTEIN UA: 30
RBC UA: NEGATIVE
SPEC GRAV UA: 1.015
Urobilinogen, UA: 0.2

## 2013-08-15 LAB — LIPID PANEL
Cholesterol: 146 mg/dL (ref 0–200)
HDL: 34 mg/dL — AB (ref >39)
LDL CALC: 94 mg/dL (ref 0–99)
TRIGLYCERIDES: 92 mg/dL (ref <150)
Total CHOL/HDL Ratio: 4.3 Ratio
VLDL: 18 mg/dL (ref 0–40)

## 2013-08-15 LAB — POCT UA - MICROSCOPIC ONLY
Amorphous: POSITIVE
CASTS, UR, LPF, POC: NEGATIVE
CRYSTALS, UR, HPF, POC: NEGATIVE
EPITHELIAL CELLS, URINE PER MICROSCOPY: NEGATIVE
MUCUS UA: POSITIVE
Yeast, UA: NEGATIVE

## 2013-08-15 MED ORDER — AMLODIPINE BESYLATE 10 MG PO TABS: ORAL_TABLET | ORAL | Status: DC

## 2013-08-15 MED ORDER — ATENOLOL 25 MG PO TABS: 25.0000 mg | ORAL_TABLET | Freq: Every day | ORAL | Status: DC

## 2013-08-15 MED ORDER — HYDROCHLOROTHIAZIDE 25 MG PO TABS: 25.0000 mg | ORAL_TABLET | Freq: Every day | ORAL | Status: DC

## 2013-08-15 NOTE — Progress Notes (Signed)
   Subjective:    Patient ID: Andre Webb, male    DOB: 07/31/1975, 38 y.o.   MRN: [id number]  HPI    Review of Systems  Constitutional: Negative.   HENT: Negative.   Eyes: Negative.   Respiratory: Negative.   Cardiovascular: Negative.   Gastrointestinal: Negative.   Endocrine: Negative.   Genitourinary: Negative.   Musculoskeletal: Negative.   Skin: Negative.   Allergic/Immunologic: Negative.   Neurological: Negative.   Hematological: Negative.   Psychiatric/Behavioral: Negative.        Objective:   Physical Exam        Assessment & Plan:

## 2013-08-15 NOTE — Progress Notes (Signed)
Subjective:  This chart was scribed for Andre SorensonEva Penny Frisbie, MD by Quintella ReichertMatthew Underwood, Scribe.  [facility] and the patient's care was started at 8:32 AM.   Patient ID: Andre NoseHoa T Keleher, male    DOB: 08/06/1975, 38 y.o.   MRN: [id number]  Chief Complaint  Patient presents with  . Annual Exam  . Medication Refill    HPI  HPI Comments: Andre Webb is a 38 y.o. male who [facility].  Pt has been checking his BP and HR at home and brings a log which shows a range of BP from 120-130/70-80, with pulse in 70s-80s.  He states he still sometimes becomes mildly lightheaded when working.  He denies CP.  He does sometimes have some mild sporadic LLQ abdominal pain which resolves quickly on its own within several minutes.  He denies bowel or bladder problems.  He denies fevers, chills, ear pain, or nasal congestion.  Pt had an extensor tendon injury in his left pinky finger several weeks ago while using a machine at work so was seen at [facility] through his workers comp.  He has ben wearing a splint for the past 3 weeks.  He is fasting today.  He is not taking any vitamins or supplements.     Patient Active Problem List   Diagnosis Date Noted  . HTN (hypertension) 12/22/2012    History reviewed. No pertinent past medical history.   Family History  Problem Relation Age of Onset  . Diabetes Father     History reviewed. No pertinent past surgical history.   Current Outpatient Prescriptions on File Prior to Visit  Medication Sig Dispense Refill  . amLODipine (NORVASC) 10 MG tablet TAKE 1 TABLET BY MOUTH EVERY DAY  90 tablet  1  . aspirin EC 81 MG tablet Take 1 tablet (81 mg total) by mouth daily.  90 tablet  3  . atenolol (TENORMIN) 25 MG tablet Take 1 tablet (25 mg total) by mouth daily.  90 tablet  1  . hydrochlorothiazide (HYDRODIURIL) 25 MG tablet Take 1 tablet (25 mg total) by mouth daily.  90 tablet  1   No current facility-administered  medications on file prior to visit.    Allergies  Allergen Reactions  . Lisinopril     Cough   . Losartan Cough    History   Social History  . Marital Status: Married    Spouse Name: N/A    Number of Children: N/A  . Years of Education: N/A   Occupational History  . Operator    Social History Main Topics  . Smoking status: Never Smoker   . Smokeless tobacco: Not on file  . Alcohol Use: No  . Drug Use: No  . Sexual Activity: No   Other Topics Concern  . Not on file   Social History Narrative   Married. Education: [employer].     Review of Systems  Constitutional: Negative.   HENT: Negative.   Eyes: Negative.   Respiratory: Negative.   Cardiovascular: Negative.   Gastrointestinal: Positive for abdominal pain. Negative for diarrhea.  Endocrine: Negative.   Genitourinary: Negative.   Musculoskeletal: Negative.   Skin: Negative.   Allergic/Immunologic: Negative.   Neurological: Positive for light-headedness.  Hematological: Negative.   Psychiatric/Behavioral: Negative.      BP 130/92  Pulse 73  Temp(Src) 98.2 F (36.8 C) (Oral)  Resp 16  Ht 5' 5.75" (1.67 m)  Wt 172  lb 9.6 oz (78.291 kg)  BMI 28.07 kg/m2  SpO2 98%    Objective:   Physical Exam  Nursing note and vitals reviewed. Constitutional: He is oriented to person, place, and time. He appears well-developed and well-nourished. No distress.  HENT:  Head: Normocephalic and atraumatic.  Right Ear: Tympanic membrane is retracted. A middle ear effusion is present.  Left Ear: Tympanic membrane is erythematous and bulging.  Nose: Mucosal edema (and erythema) present.  Hearing to soft sound normal.  Eyes: Conjunctivae and EOM are normal.  Neck: Neck supple. No tracheal deviation present. No thyromegaly present.  Cardiovascular: Normal rate, regular rhythm, S1 normal and S2 normal.   No murmur heard. Pulses:      Dorsalis pedis pulses are 1+ on the right side, and 1+ on the left side.  No lower  extremity edema  Pulmonary/Chest: Effort normal and breath sounds normal. No respiratory distress. He has no decreased breath sounds. He has no wheezes. He has no rhonchi. He has no rales.  Abdominal: Soft. Bowel sounds are normal. He exhibits no distension and no mass. There is no hepatosplenomegaly.  Genitourinary: Testes normal. Right testis shows no mass, no swelling and no tenderness. Left testis shows no mass, no swelling and no tenderness.  Musculoskeletal: Normal range of motion.  Lymphadenopathy:    He has no cervical adenopathy.  Neurological: He is alert and oriented to person, place, and time.  Reflex Scores:      Patellar reflexes are 2+ on the right side and 2+ on the left side. Skin: Skin is warm and dry. No lesion and no rash noted.  Psychiatric: He has a normal mood and affect. His behavior is normal.     Visual Acuity Screening   Right eye Left eye Both eyes  Without correction: 20/25 20/25 20/20   With correction:            Assessment & Plan:   Routine general medical examination at a health care facility - Plan: POCT UA - Microscopic Only, POCT urinalysis dipstick  Essential hypertension - Plan: Lipid panel, Comprehensive metabolic panel - BP outside of office reviewed and at goal - refilled meds x 1 yr, RTC if BP worsens  Encounter for long-term (current) use of other medications - Plan: Comprehensive metabolic panel  Bilateral chronic serous otitis media - Plan: CBC with Differential - pt asymptomatic so no treatment needed currently  Need for Tdap vaccination - Plan: Tdap vaccine greater than or equal to 7yo IM  Meds ordered this encounter  Medications  . hydrochlorothiazide (HYDRODIURIL) 25 MG tablet    Sig: Take 1 tablet (25 mg total) by mouth daily.    Dispense:  90 tablet    Refill:  3  . atenolol (TENORMIN) 25 MG tablet    Sig: Take 1 tablet (25 mg total) by mouth daily.    Dispense:  90 tablet    Refill:  3  . amLODipine (NORVASC) 10 MG tablet     Sig: TAKE 1 TABLET BY MOUTH EVERY DAY    Dispense:  90 tablet    Refill:  3    I personally performed the services described in this documentation, which was scribed in my presence. The recorded information has been reviewed and considered, and addended by me as needed.  Andre Sorenson, MD MPH

## 2013-08-15 NOTE — Patient Instructions (Addendum)
Keeping you healthy  Get these tests  Blood pressure- Have your blood pressure checked once a year by your healthcare provider.  Normal blood pressure is 120/80.  Weight- Have your body mass index (BMI) calculated to screen for obesity.  BMI is a measure of body fat based on height and weight. You can also calculate your own BMI at https://www.west-esparza.com/.  Cholesterol- Have your cholesterol checked regularly starting at age 40, sooner may be necessary if you have diabetes, high blood pressure, if a family member developed heart diseases at an early age or if you smoke.   Chlamydia, HIV, and other sexual transmitted disease- Get screened each year until the age of 61 then within three months of each new sexual partner.  Diabetes- Have your blood sugar checked regularly if you have high blood pressure, high cholesterol, a family history of diabetes or if you are overweight.  Get these vaccines  Flu shot- Every fall.  Tetanus shot- Every 10 years.  Menactra- Single dose; prevents meningitis.  Take these steps  Don't smoke- If you do smoke, ask your healthcare provider about quitting. For tips on how to quit, go to www.smokefree.gov or call 1-800-QUIT-NOW.  Be physically active- Exercise 5 days a week for at least 30 minutes.  If you are not already physically active start slow and gradually work up to 30 minutes of moderate physical activity.  Examples of moderate activity include walking briskly, mowing the yard, dancing, swimming bicycling, etc.  Eat a healthy diet- Eat a variety of healthy foods such as fruits, vegetables, low fat milk, low fat cheese, yogurt, lean meats, poultry, fish, beans, tofu, etc.  For more information on healthy eating, go to www.thenutritionsource.org  Drink alcohol in moderation- Limit alcohol intake two drinks or less a day.  Never drink and drive.  Dentist- Brush and floss teeth twice daily; visit your dentis twice a year.  Depression-Your emotional  health is as important as your physical health.  If you're feeling down, losing interest in things you normally enjoy please talk with your healthcare provider.  Gun Safety- If you keep a gun in your home, keep it unloaded and with the safety lock on.  Bullets should be stored separately.  Helmet use- Always wear a helmet when riding a motorcycle, bicycle, rollerblading or skateboarding.  Safe sex- If you may be exposed to a sexually transmitted infection, use a condom  Seat belts- Seat bels can save your life; always wear one.  Smoke/Carbon Monoxide detectors- These detectors need to be installed on the appropriate level of your home.  Replace batteries at least once a year.  Skin Cancer- When out in the sun, cover up and use sunscreen SPF 15 or higher.  Violence- If anyone is threatening or hurting you, please tell your healthcare provider.  Managing Your High Blood Pressure Blood pressure is a measurement of how forceful your blood is pressing against the walls of the arteries. Arteries are muscular tubes within the circulatory system. Blood pressure does not stay the same. Blood pressure rises when you are active, excited, or nervous; and it lowers during sleep and relaxation. If the numbers measuring your blood pressure stay above normal most of the time, you are at risk for health problems. High blood pressure (hypertension) is a long-term (chronic) condition in which blood pressure is elevated. A blood pressure reading is recorded as two numbers, such as 120 over 80 (or 120/80). The first, higher number is called the systolic pressure. It is a  measure of the pressure in your arteries as the heart beats. The second, lower number is called the diastolic pressure. It is a measure of the pressure in your arteries as the heart relaxes between beats.  Keeping your blood pressure in a normal range is important to your overall health and prevention of health problems, such as heart disease and  stroke. When your blood pressure is uncontrolled, your heart has to work harder than normal. High blood pressure is a very common condition in adults because blood pressure tends to rise with age. Men and women are equally likely to have hypertension but at different times in life. Before age 40, men are more likely to have hypertension. After 40 years of age, women are more likely to have it. Hypertension is especially common in African Americans. This condition often has no signs or symptoms. The cause of the condition is usually not known. Your caregiver can help you come up with a plan to keep your blood pressure in a normal, healthy range. BLOOD PRESSURE STAGES Blood pressure is classified into four stages: normal, prehypertension, stage 1, and stage 2. Your blood pressure reading will be used to determine what type of treatment, if any, is necessary. Appropriate treatment options are tied to these four stages:  Normal  Systolic pressure (mm Hg): below 120.  Diastolic pressure (mm Hg): below 80. Prehypertension  Systolic pressure (mm Hg): 120 to 139.  Diastolic pressure (mm Hg): 80 to 89. Stage1  Systolic pressure (mm Hg): 140 to 159.  Diastolic pressure (mm Hg): 90 to 99. Stage2  Systolic pressure (mm Hg): 160 or above.  Diastolic pressure (mm Hg): 100 or above. RISKS RELATED TO HIGH BLOOD PRESSURE Managing your blood pressure is an important responsibility. Uncontrolled high blood pressure can lead to:  A heart attack.  A stroke.  A weakened blood vessel (aneurysm).  Heart failure.  Kidney damage.  Eye damage.  Metabolic syndrome.  Memory and concentration problems. HOW TO MANAGE YOUR BLOOD PRESSURE Blood pressure can be managed effectively with lifestyle changes and medicines (if needed). Your caregiver will help you come up with a plan to bring your blood pressure within a normal range. Your plan should include the following: Education  Read all information  provided by your caregivers about how to control blood pressure.  Educate yourself on the latest guidelines and treatment recommendations. New research is always being done to further define the risks and treatments for high blood pressure. Lifestylechanges  Control your weight.  Avoid smoking.  Stay physically active.  Reduce the amount of salt in your diet.  Reduce stress.  Control any chronic conditions, such as high cholesterol or diabetes.  Reduce your alcohol intake. Medicines  Several medicines (antihypertensive medicines) are available, if needed, to bring blood pressure within a normal range. Communication  Review all the medicines you take with your caregiver because there may be side effects or interactions.  Talk with your caregiver about your diet, exercise habits, and other lifestyle factors that may be contributing to high blood pressure.  See your caregiver regularly. Your caregiver can help you create and adjust your plan for managing high blood pressure. RECOMMENDATIONS FOR TREATMENT AND FOLLOW-UP  The following recommendations are based on current guidelines for managing high blood pressure in nonpregnant adults. Use these recommendations to identify the proper follow-up period or treatment option based on your blood pressure reading. You can discuss these options with your caregiver.  Systolic pressure of 120 to 139 or diastolic  pressure of 80 to 89: Follow up with your caregiver as directed.  Systolic pressure of 140 to 160 or diastolic pressure of 90 to 100: Follow up with your caregiver within 2 months.  Systolic pressure above 160 or diastolic pressure above 100: Follow up with your caregiver within 1 month.  Systolic pressure above 180 or diastolic pressure above 110: Consider antihypertensive therapy; follow up with your caregiver within 1 week.  Systolic pressure above 200 or diastolic pressure above 120: Begin antihypertensive therapy; follow up  with your caregiver within 1 week. Document Released: 10/18/2011 Document Reviewed: 10/18/2011 [facility] Patient Information 2015 [place], [place]. This information is not intended to replace advice given to you by your health care provider. Make sure you discuss any questions you have with your health care provider. DASH Eating Plan DASH stands for "Dietary Approaches to Stop Hypertension." The DASH eating plan is a healthy eating plan that has been shown to reduce high blood pressure (hypertension). Additional health benefits may include reducing the risk of type 2 diabetes mellitus, heart disease, and stroke. The DASH eating plan may also help with weight loss. WHAT DO I NEED TO KNOW ABOUT THE DASH EATING PLAN? For the DASH eating plan, you will follow these general guidelines:  Choose foods with a percent daily value for sodium of less than 5% (as listed on the food label).  Use salt-free seasonings or herbs instead of table salt or sea salt.  Check with your health care provider or pharmacist before using salt substitutes.  Eat lower-sodium products, often labeled as "lower sodium" or "no salt added."  Eat fresh foods.  Eat more vegetables, fruits, and low-fat dairy products.  Choose whole grains. Look for the word "whole" as the first word in the ingredient list.  Choose fish and skinless chicken or [place] more often than red meat. Limit fish, poultry, and meat to 6 oz (170 g) each day.  Limit sweets, desserts, sugars, and sugary drinks.  Choose heart-healthy fats.  Limit cheese to 1 oz (28 g) per day.  Eat more home-cooked food and less restaurant, buffet, and fast food.  Limit fried foods.  Cook foods using methods other than frying.  Limit canned vegetables. If you do use them, rinse them well to decrease the sodium.  When eating at a restaurant, ask that your food be prepared with less salt, or no salt if possible. WHAT FOODS CAN I EAT? Seek help from a dietitian for  individual calorie needs. Grains Whole grain or whole wheat bread. Brown rice. Whole grain or whole wheat pasta. Quinoa, bulgur, and whole grain cereals. Low-sodium cereals. Corn or whole wheat flour tortillas. Whole grain cornbread. Whole grain crackers. Low-sodium crackers. Vegetables Fresh or frozen vegetables (raw, steamed, roasted, or grilled). Low-sodium or reduced-sodium tomato and vegetable juices. Low-sodium or reduced-sodium tomato sauce and paste. Low-sodium or reduced-sodium canned vegetables.  Fruits All fresh, canned (in natural juice), or frozen fruits. Meat and Other Protein Products Ground beef (85% or leaner), grass-fed beef, or beef trimmed of fat. Skinless chicken or [place]. Ground chicken or [place]. Pork trimmed of fat. All fish and seafood. Eggs. Dried beans, peas, or lentils. Unsalted nuts and seeds. Unsalted canned beans. Dairy Low-fat dairy products, such as skim or 1% milk, 2% or reduced-fat cheeses, low-fat ricotta or cottage cheese, or plain low-fat yogurt. Low-sodium or reduced-sodium cheeses. Fats and Oils Tub margarines without trans fats. Light or reduced-fat mayonnaise and salad dressings (reduced sodium). Avocado. Safflower, olive, or canola oils. Natural  peanut or almond butter. Other Unsalted popcorn and pretzels. The items listed above may not be a complete list of recommended foods or beverages. Contact your dietitian for more options. WHAT FOODS ARE NOT RECOMMENDED? Grains White bread. White pasta. White rice. Refined cornbread. Bagels and croissants. Crackers that contain trans fat. Vegetables Creamed or fried vegetables. Vegetables in a cheese sauce. Regular canned vegetables. Regular canned tomato sauce and paste. Regular tomato and vegetable juices. Fruits Dried fruits. Canned fruit in light or heavy syrup. Fruit juice. Meat and Other Protein Products Fatty cuts of meat. Ribs, chicken wings, bacon, sausage, bologna, salami, chitterlings, fatback, hot  dogs, bratwurst, and packaged luncheon meats. Salted nuts and seeds. Canned beans with salt. Dairy Whole or 2% milk, cream, half-and-half, and cream cheese. Whole-fat or sweetened yogurt. Full-fat cheeses or blue cheese. Nondairy creamers and whipped toppings. Processed cheese, cheese spreads, or cheese curds. Condiments Onion and garlic salt, seasoned salt, table salt, and sea salt. Canned and packaged gravies. Worcestershire sauce. Tartar sauce. Barbecue sauce. Teriyaki sauce. Soy sauce, including reduced sodium. Steak sauce. Fish sauce. Oyster sauce. Cocktail sauce. Horseradish. Ketchup and mustard. Meat flavorings and tenderizers. Bouillon cubes. Hot sauce. Tabasco sauce. Marinades. Taco seasonings. Relishes. Fats and Oils Butter, stick margarine, lard, shortening, ghee, and bacon fat. Coconut, palm kernel, or palm oils. Regular salad dressings. Other Pickles and olives. Salted popcorn and pretzels. The items listed above may not be a complete list of foods and beverages to avoid. Contact your dietitian for more information. WHERE CAN I FIND MORE INFORMATION? National Heart, Lung, and Blood Institute: CableProm[employer].it Document Released: 01/12/2011 Document Revised: 01/28/2013 Document Reviewed: 11/27/2012 [facility] Patient Information 2015 [place], [place]. This information is not intended to replace advice given to you by your health care provider. Make sure you discuss any questions you have with your health care provider.

## 2014-05-12 ENCOUNTER — Encounter: Payer: Self-pay | Admitting: *Deleted

## 2014-05-12 ENCOUNTER — Ambulatory Visit (INDEPENDENT_AMBULATORY_CARE_PROVIDER_SITE_OTHER): Payer: BLUE CROSS/BLUE SHIELD | Admitting: Family Medicine

## 2014-05-12 VITALS — BP 118/80 | HR 85 | Temp 98.4°F | Resp 20 | Ht 66.0 in | Wt 167.5 lb

## 2014-05-12 DIAGNOSIS — J111 Influenza due to unidentified influenza virus with other respiratory manifestations: Secondary | ICD-10-CM | POA: Diagnosis not present

## 2014-05-12 DIAGNOSIS — B349 Viral infection, unspecified: Secondary | ICD-10-CM

## 2014-05-12 DIAGNOSIS — R69 Illness, unspecified: Secondary | ICD-10-CM

## 2014-05-12 DIAGNOSIS — R509 Fever, unspecified: Secondary | ICD-10-CM | POA: Diagnosis not present

## 2014-05-12 LAB — POCT INFLUENZA A/B
Influenza A, POC: NEGATIVE
Influenza B, POC: NEGATIVE

## 2014-05-12 LAB — POCT CBC
Granulocyte percent: 63.9 %G (ref 37–80)
HCT, POC: 49.5 % (ref 43.5–53.7)
HEMOGLOBIN: 16.2 g/dL (ref 14.1–18.1)
LYMPH, POC: 1.5 (ref 0.6–3.4)
MCH: 29 pg (ref 27–31.2)
MCHC: 32.7 g/dL (ref 31.8–35.4)
MCV: 88.8 fL (ref 80–97)
MID (CBC): 0.7 (ref 0–0.9)
MPV: 7.5 fL (ref 0–99.8)
PLATELET COUNT, POC: 247 10*3/uL (ref 142–424)
POC Granulocyte: 3.8 (ref 2–6.9)
POC LYMPH %: 24.6 % (ref 10–50)
POC MID %: 11.5 % (ref 0–12)
RBC: 5.57 M/uL (ref 4.69–6.13)
RDW, POC: 13.8 %
WBC: 6 10*3/uL (ref 4.6–10.2)

## 2014-05-12 LAB — POCT SEDIMENTATION RATE: POCT SED RATE: 30 mm/h — AB (ref 0–22)

## 2014-05-12 MED ORDER — AMLODIPINE BESYLATE 10 MG PO TABS: ORAL_TABLET | ORAL | Status: DC

## 2014-05-12 MED ORDER — HYDROCHLOROTHIAZIDE 25 MG PO TABS: 25.0000 mg | ORAL_TABLET | Freq: Every day | ORAL | Status: DC

## 2014-05-12 MED ORDER — ATENOLOL 25 MG PO TABS: 25.0000 mg | ORAL_TABLET | Freq: Every day | ORAL | Status: DC

## 2014-05-12 MED ORDER — HYDROCOD POLST-CHLORPHEN POLST 10-8 MG/5ML PO LQCR: 5.0000 mL | Freq: Two times a day (BID) | ORAL | Status: DC | PRN

## 2014-05-12 MED ORDER — IBUPROFEN 600 MG PO TABS: 600.0000 mg | ORAL_TABLET | Freq: Three times a day (TID) | ORAL | Status: DC | PRN

## 2014-05-12 MED ORDER — GUAIFENESIN ER 1200 MG PO TB12: 1.0000 | ORAL_TABLET | Freq: Two times a day (BID) | ORAL | Status: DC | PRN

## 2014-05-12 NOTE — Patient Instructions (Addendum)
You should stay away from all cough and cold medications containing decongestant, especially phenylephrine and pseudoephedrine (will be listed under the active ingredient list).  To make it easier, CoricidinHBP is a product tailored towards people with hypertension.   Influenza Influenza ("the flu") is a viral infection of the respiratory tract. It occurs more often in winter months because people spend more time in close contact with one another. Influenza can make you feel very sick. Influenza easily spreads from person to person (contagious). CAUSES  Influenza is caused by a virus that infects the respiratory tract. You can catch the virus by breathing in droplets from an infected person's cough or sneeze. You can also catch the virus by touching something that was recently contaminated with the virus and then touching your mouth, nose, or eyes. RISKS AND COMPLICATIONS You may be at risk for a more severe case of influenza if you smoke cigarettes, have diabetes, have chronic heart disease (such as heart failure) or lung disease (such as asthma), or if you have a weakened immune system. Elderly people and pregnant women are also at risk for more serious infections. The most common problem of influenza is a lung infection (pneumonia). Sometimes, this problem can require emergency medical care and may be life threatening. SIGNS AND SYMPTOMS  Symptoms typically last 4 to 10 days and may include:  Fever.  Chills.  Headache, body aches, and muscle aches.  Sore throat.  Chest discomfort and cough.  Poor appetite.  Weakness or feeling tired.  Dizziness.  Nausea or vomiting. DIAGNOSIS  Diagnosis of influenza is often made based on your history and a physical exam. A nose or throat swab test can be done to confirm the diagnosis. TREATMENT  In mild cases, influenza goes away on its own. Treatment is directed at relieving symptoms. For more severe cases, your health care provider may prescribe  antiviral medicines to shorten the sickness. Antibiotic medicines are not effective because the infection is caused by a virus, not by bacteria. HOME CARE INSTRUCTIONS  Take medicines only as directed by your health care provider.  Use a cool mist humidifier to make breathing easier.  Get plenty of rest until your temperature returns to normal. This usually takes 3 to 4 days.  Drink enough fluid to keep your urine clear or pale yellow.  Cover yourmouth and nosewhen coughing or sneezing,and wash your handswellto prevent thevirusfrom spreading.  Stay homefromwork orschool untilthe fever is gonefor at least 271full day. PREVENTION  An annual influenza vaccination (flu shot) is the best way to avoid getting influenza. An annual flu shot is now routinely recommended for all adults in the [place] SEEK MEDICAL CARE IF:  You experiencechest pain, yourcough worsens,or you producemore mucus.  Youhave nausea,vomiting, ordiarrhea.  Your fever returns or gets worse. SEEK IMMEDIATE MEDICAL CARE IF:  You havetrouble breathing, you become short of breath,or your skin ornails becomebluish.  You have severe painor stiffnessin the neck.  You develop a sudden headache, or pain in the face or ear.  You have nausea or vomiting that you cannot control. MAKE SURE YOU:   Understand these instructions.  Will watch your condition.  Will get help right away if you are not doing well or get worse. Document Released: 01/21/2000 Document Revised: 06/09/2013 Document Reviewed: 04/24/2011 [facility] Patient Information 2015 [place], [place]. This information is not intended to replace advice given to you by your health care provider. Make sure you discuss any questions you have with your health care provider.

## 2014-05-12 NOTE — Progress Notes (Signed)
Subjective:  This chart was scribed for Norberto Sorenson MD, by Veverly Fells, at [facility].  This patient was seen in room 9  and the patient's care was started at 12:00 PM.   Chief Complaint  Patient presents with  . Fever    fever only at night, sweats, body aches  . Cough    dry cough      Patient ID: Andre Webb, male    DOB: 05/17/1975, 39 y.o.   MRN: [id number]  HPI  HPI Comments: Andre Webb is a 39 y.o. male who presents to [facility] multiple symptoms today.  Patient notes of a  fever and cough onset 3 days ago.  Patient has associated symptoms of body aches, headache, sweats/ chills and diziness.  Patient has taken Nyquil and Advil but denies any relief.  Patient states that he is sleeping okay (probably due to exhaustion from working all day).  Patient denies nausea, vomiting, congestion.  Patient has had his flu shot this year and thinks he may have gotten ill from others at work.    Patient of mine with malignant hypertension but has responded well to current medication regimen and is very active individual last seen for his full physical.  Last liver evaluation was normal.        Past Medical History  Diagnosis Date  . Hypertension     Current Outpatient Prescriptions on File Prior to Visit  Medication Sig Dispense Refill  . amLODipine (NORVASC) 10 MG tablet TAKE 1 TABLET BY MOUTH EVERY DAY 90 tablet 3  . aspirin EC 81 MG tablet Take 1 tablet (81 mg total) by mouth daily. 90 tablet 3  . atenolol (TENORMIN) 25 MG tablet Take 1 tablet (25 mg total) by mouth daily. 90 tablet 3  . hydrochlorothiazide (HYDRODIURIL) 25 MG tablet Take 1 tablet (25 mg total) by mouth daily. 90 tablet 3   No current facility-administered medications on file prior to visit.    Allergies  Allergen Reactions  . Lisinopril     Cough   . Losartan Cough    Review of Systems  Constitutional: Positive for fever, chills and diaphoresis.    HENT: Negative for congestion, drooling and nosebleeds.   Eyes: Negative for discharge.  Respiratory: Positive for cough.   Gastrointestinal: Negative for nausea and vomiting.  Neurological: Positive for dizziness and headaches. Negative for syncope.       Objective:   Physical Exam  Constitutional: He is oriented to person, place, and time. He appears well-developed and well-nourished. No distress.  HENT:  Head: Normocephalic and atraumatic.  Mouth/Throat: Oropharynx is clear and moist.  Left TM retraction, erythema, mid ear effusion Right TM same but more mild.   Eyes: Conjunctivae and EOM are normal.  Neck: Neck supple. No thyromegaly present.  Cardiovascular: Normal rate, regular rhythm, S1 normal, S2 normal and normal heart sounds.   No murmur heard. Pulmonary/Chest: Effort normal and breath sounds normal. No respiratory distress. He has no wheezes. He has no rales.  Lungs clear, good air movement.   Musculoskeletal: Normal range of motion.  Lymphadenopathy:    He has no cervical adenopathy.  Neurological: He is alert and oriented to person, place, and time.  Skin:  Diaphoretic.   Psychiatric: He has a normal mood and affect. His behavior is normal.  Nursing note and vitals reviewed.   BP 118/80 mmHg  Pulse 85  Temp(Src) 98.4 F (36.9 C) (Oral)  Resp 20  Ht 5\' 6"  (1.676 m)  Wt 167 lb 8 oz (75.978 kg)  BMI 27.05 kg/m2  SpO2 96%  Results for orders placed or performed in visit on 05/12/14  POCT Influenza A/B  Result Value Ref Range   Influenza A, POC Negative    Influenza B, POC Negative           Assessment & Plan:   Fever, unspecified fever cause - Plan: POCT Influenza A/B, POCT CBC, POCT SEDIMENTATION RATE  Viral syndrome  Influenza-like illness  Meds ordered this encounter  Medications  . chlorpheniramine-HYDROcodone (TUSSIONEX PENNKINETIC ER) 10-8 MG/5ML LQCR    Sig: Take 5 mLs by mouth every 12 (twelve) hours as needed.    Dispense:  120 mL     Refill:  0  . Guaifenesin (MUCINEX MAXIMUM STRENGTH) 1200 MG TB12    Sig: Take 1 tablet (1,200 mg total) by mouth every 12 (twelve) hours as needed.    Dispense:  14 tablet    Refill:  1  . ibuprofen (ADVIL,MOTRIN) 600 MG tablet    Sig: Take 1 tablet (600 mg total) by mouth every 8 (eight) hours as needed for fever, headache or mild pain.    Dispense:  30 tablet    Refill:  0  . amLODipine (NORVASC) 10 MG tablet    Sig: TAKE 1 TABLET BY MOUTH EVERY DAY    Dispense:  90 tablet    Refill:  3  . atenolol (TENORMIN) 25 MG tablet    Sig: Take 1 tablet (25 mg total) by mouth daily.    Dispense:  90 tablet    Refill:  3  . hydrochlorothiazide (HYDRODIURIL) 25 MG tablet    Sig: Take 1 tablet (25 mg total) by mouth daily.    Dispense:  90 tablet    Refill:  3    I personally performed the services described in this documentation, which was scribed in my presence. The recorded information has been reviewed and considered, and addended by me as needed.  Norberto SorensonEva Shaw, MD MPH

## 2014-06-29 ENCOUNTER — Ambulatory Visit: Payer: BLUE CROSS/BLUE SHIELD | Admitting: Family Medicine

## 2014-08-21 ENCOUNTER — Ambulatory Visit (INDEPENDENT_AMBULATORY_CARE_PROVIDER_SITE_OTHER): Payer: BLUE CROSS/BLUE SHIELD | Admitting: Family Medicine

## 2014-08-21 ENCOUNTER — Encounter: Payer: Self-pay | Admitting: Family Medicine

## 2014-08-21 ENCOUNTER — Encounter: Payer: BC Managed Care – PPO | Admitting: Family Medicine

## 2014-08-21 VITALS — BP 132/98 | HR 70 | Temp 98.3°F | Resp 16 | Ht 66.0 in | Wt 170.6 lb

## 2014-08-21 DIAGNOSIS — Z0000 Encounter for general adult medical examination without abnormal findings: Secondary | ICD-10-CM | POA: Diagnosis not present

## 2014-08-21 DIAGNOSIS — I10 Essential (primary) hypertension: Secondary | ICD-10-CM

## 2014-08-21 DIAGNOSIS — Z136 Encounter for screening for cardiovascular disorders: Secondary | ICD-10-CM

## 2014-08-21 DIAGNOSIS — Z113 Encounter for screening for infections with a predominantly sexual mode of transmission: Secondary | ICD-10-CM

## 2014-08-21 DIAGNOSIS — Z1389 Encounter for screening for other disorder: Secondary | ICD-10-CM

## 2014-08-21 DIAGNOSIS — Z79899 Other long term (current) drug therapy: Secondary | ICD-10-CM

## 2014-08-21 DIAGNOSIS — Z1383 Encounter for screening for respiratory disorder NEC: Secondary | ICD-10-CM | POA: Diagnosis not present

## 2014-08-21 LAB — COMPREHENSIVE METABOLIC PANEL
ALK PHOS: 51 U/L (ref 39–117)
ALT: 27 U/L (ref 0–53)
AST: 23 U/L (ref 0–37)
Albumin: 4.6 g/dL (ref 3.5–5.2)
BUN: 15 mg/dL (ref 6–23)
CALCIUM: 9.6 mg/dL (ref 8.4–10.5)
CHLORIDE: 99 meq/L (ref 96–112)
CO2: 30 mEq/L (ref 19–32)
CREATININE: 0.9 mg/dL (ref 0.50–1.35)
GLUCOSE: 91 mg/dL (ref 70–99)
Potassium: 3.5 mEq/L (ref 3.5–5.3)
SODIUM: 141 meq/L (ref 135–145)
TOTAL PROTEIN: 7.6 g/dL (ref 6.0–8.3)
Total Bilirubin: 0.6 mg/dL (ref 0.2–1.2)

## 2014-08-21 LAB — HIV ANTIBODY (ROUTINE TESTING W REFLEX): HIV: NONREACTIVE

## 2014-08-21 NOTE — Patient Instructions (Signed)

## 2014-08-21 NOTE — Progress Notes (Signed)
Subjective:  This chart was scribed for Andre Sorenson, MD by Andrew Au, ED Scribe. This patient was seen in room 21 and the patient's care was started at 8:47 AM.  Patient ID: Andre Webb, male    DOB: 09-Feb-1975, 39 y.o.   MRN: [id number]  HPI   Chief Complaint  Patient presents with  . Annual Exam   HPI Comments: [name] is a 39 y.o. male who presents to the [facility] for an annual exam. I saw pt for acute illness 3 months and he had a CPE with me 1 year prior. Fasting labs were done at that time. Checks BP at home. Lipid panel has been well controlled w/ LDL 94 and non-hdl of 112. nml cbc, cmp, tsh last yr. TDaP 08/2013  He denies intolerance with medication. He has been checking BP at home and BP this morning was  120/75 prior to visit. States work has been well.   Past Medical History  Diagnosis Date  . Hypertension    No past surgical history on file. Prior to Admission medications   Medication Sig Start Date End Date Taking? Authorizing Provider  amLODipine (NORVASC) 10 MG tablet TAKE 1 TABLET BY MOUTH EVERY DAY 05/12/14  Yes Sherren Mocha, MD  aspirin EC 81 MG tablet Take 1 tablet (81 mg total) by mouth daily. 12/04/12  Yes Sherren Mocha, MD  atenolol (TENORMIN) 25 MG tablet Take 1 tablet (25 mg total) by mouth daily. 05/12/14  Yes Sherren Mocha, MD  hydrochlorothiazide (HYDRODIURIL) 25 MG tablet Take 1 tablet (25 mg total) by mouth daily. 05/12/14  Yes Sherren Mocha, MD  ibuprofen (ADVIL,MOTRIN) 600 MG tablet Take 1 tablet (600 mg total) by mouth every 8 (eight) hours as needed for fever, headache or mild pain. 05/12/14  Yes Sherren Mocha, MD   Allergies  Allergen Reactions  . Lisinopril     Cough   . Losartan Cough   Family History  Problem Relation Age of Onset  . Diabetes Father    History   Social History  . Marital Status: Married    Spouse Name: N/A  . Number of Children: N/A  . Years of Education: N/A   Occupational History  . Operator    Social  History Main Topics  . Smoking status: Never Smoker   . Smokeless tobacco: Not on file  . Alcohol Use: No  . Drug Use: No  . Sexual Activity: No   Other Topics Concern  . None   Social History Narrative   Married. Education: [employer].    Review of Systems  All other systems reviewed and are negative.   Objective:   Physical Exam  Constitutional: He is oriented to person, place, and time. He appears well-developed and well-nourished. No distress.  HENT:  Head: Normocephalic and atraumatic.  Right Ear: Tympanic membrane is erythematous.  Left Ear: Tympanic membrane is injected and retracted.  Mouth/Throat: Oropharynx is clear and moist.  Eyes: Conjunctivae and EOM are normal.  Neck: Neck supple. No thyromegaly present.  Cardiovascular: Normal rate, regular rhythm, S1 normal and S2 normal.  Exam reveals no gallop and no friction rub.   No murmur heard. 2+ pedal pulses bilaterally  Pulmonary/Chest: Effort normal and breath sounds normal. No respiratory distress. He has no wheezes. He has no rales. He exhibits no tenderness.  Abdominal: Soft. Bowel sounds are normal. He exhibits no distension and no mass. There is no hepatosplenomegaly.  There is no tenderness. There is no rebound and no guarding. Hernia confirmed negative in the right inguinal area and confirmed negative in the left inguinal area.  Genitourinary: Testes normal.  Musculoskeletal: Normal range of motion. He exhibits no edema.  No LE edema  Neurological: He is alert and oriented to person, place, and time.  Reflex Scores:      Patellar reflexes are 2+ on the right side and 2+ on the left side. Skin: Skin is warm and dry.  Psychiatric: He has a normal mood and affect. His behavior is normal.  Nursing note and vitals reviewed.  BP recheck 132/90   Filed Vitals:   08/21/14 0821  BP: 132/98  Pulse: 70  Temp: 98.3 F (36.8 C)  TempSrc: Oral  Resp: 16  Height: 5\' 6"  (1.676 m)  Weight: 170 lb 9.6 oz (77.384  kg)  SpO2: 98%    Assessment & Plan:    Pt is to f/u in 1 year for fasting labs. Okay to refill BP medications for 1 yr when requested.  1. Health maintenance examination   2. Essential hypertension   3. Polypharmacy   4. Routine screening for STI (sexually transmitted infection)   5. Screening for cardiovascular, respiratory, and genitourinary diseases     Orders Placed This Encounter  Procedures  . HIV antibody  . Comprehensive metabolic panel    I personally performed the services described in this documentation, which was scribed in my presence. The recorded information has been reviewed and considered, and addended by me as needed.  Andre SorensonEva Shaw, MD MPH

## 2014-08-21 NOTE — Progress Notes (Deleted)
Here for CPE.  I saw pt for acute illness 3 mos ago and he had a CPE with me just 1 yr prior.  Fasting labs were done at that time.  Checks BP at home.  Lipid panel has been well controlled w/ LDL 94 and non-hdl of 112.   nml cbc, cmp, tsh last yr.  TDaP 08/2013

## 2014-08-22 ENCOUNTER — Encounter: Payer: Self-pay | Admitting: Family Medicine

## 2015-05-09 ENCOUNTER — Other Ambulatory Visit: Payer: Self-pay | Admitting: Family Medicine

## 2015-06-09 ENCOUNTER — Other Ambulatory Visit: Payer: Self-pay | Admitting: Family Medicine

## 2015-06-15 ENCOUNTER — Telehealth: Payer: Self-pay

## 2015-06-15 MED ORDER — ATENOLOL 25 MG PO TABS: ORAL_TABLET | ORAL | Status: DC

## 2015-06-15 MED ORDER — HYDROCHLOROTHIAZIDE 25 MG PO TABS: ORAL_TABLET | ORAL | Status: DC

## 2015-06-15 MED ORDER — AMLODIPINE BESYLATE 10 MG PO TABS: 10.0000 mg | ORAL_TABLET | Freq: Every day | ORAL | Status: DC

## 2015-06-15 NOTE — Telephone Encounter (Signed)
Pt is needing to get refills on 3 of his medications which his sister did not know the names of but did state blood pressure meds and that he has an appt with dr Clelia Croftshaw in July pt sister request that we put this message as high priority because pt is out of meds  Best number [phone]

## 2015-06-15 NOTE — Telephone Encounter (Signed)
Sent in RFs to cover him until appt and notified sister who agreed to tell him to be sure to keep his appt.

## 2015-08-13 ENCOUNTER — Other Ambulatory Visit: Payer: Self-pay | Admitting: Family Medicine

## 2015-08-16 ENCOUNTER — Telehealth: Payer: Self-pay | Admitting: Emergency Medicine

## 2015-08-16 NOTE — Telephone Encounter (Signed)
I received call from daughter Jacki ConesLaurie the patient had run out of his medications. Message received 6 PM Saturday evening. I sent in refills for his atenolol amlodipine and hydrochlorothiazide to the [facility]. He was given enough medication for 3 months and advised to make an appointment for follow-up with Dr. Clelia CroftShaw

## 2015-08-25 NOTE — Progress Notes (Signed)
Subjective:    Patient ID: Andre Webb, male    DOB: January 21, 1976, 40 y.o.   MRN: [id number] Chief Complaint  Patient presents with  . Annual Exam    HPI  Mr. Cyndie Chimeguyen is a 40 yo male here today for his CPE. I last saw him 1 yr prior for the same.  HTN: On amlodipine 10, atenolol 25, and hctz 25. Checks BP at home.  BP 120s/80s.  Occ orthostatic sxs when he moves to fast at work but otherwise doing well.  Tries to eat healthy but not all the time.  Walks for exercise about an hour a day and has a very active job.  Is taking omega-3 supp. Prior lipid panel has been well controlled  Imm: TDaP 08/2013  Past Medical History  Diagnosis Date  . Hypertension    History reviewed. No pertinent past surgical history. Current Outpatient Prescriptions on File Prior to Visit  Medication Sig Dispense Refill  . aspirin EC 81 MG tablet Take 1 tablet (81 mg total) by mouth daily. 90 tablet 3  . ibuprofen (ADVIL,MOTRIN) 600 MG tablet Take 1 tablet (600 mg total) by mouth every 8 (eight) hours as needed for fever, headache or mild pain. 30 tablet 0   No current facility-administered medications on file prior to visit.   Allergies  Allergen Reactions  . Lisinopril     Cough   . Losartan Cough   Family History  Problem Relation Age of Onset  . Diabetes Father    Social History   Social History  . Marital Status: Married    Spouse Name: N/A  . Number of Children: N/A  . Years of Education: N/A   Occupational History  . Operator    Social History Main Topics  . Smoking status: Never Smoker   . Smokeless tobacco: None  . Alcohol Use: No  . Drug Use: No  . Sexual Activity: No   Other Topics Concern  . None   Social History Narrative   Married. Education: [employer].     Review of Systems  All other systems reviewed and are negative.      Objective:  BP 136/82 mmHg  Pulse 73  Temp(Src) 98.4 F (36.9 C) (Oral)  Resp 18  Ht 5\' 6"  (1.676 m)  Wt 176 lb (79.833 kg)  BMI  28.42 kg/m2  SpO2 99%  Physical Exam  Constitutional: He is oriented to person, place, and time. He appears well-developed and well-nourished. No distress.  HENT:  Head: Normocephalic and atraumatic.  Right Ear: Tympanic membrane, external ear and ear canal normal.  Left Ear: Tympanic membrane, external ear and ear canal normal.  Nose: Nose normal.  Mouth/Throat: Uvula is midline, oropharynx is clear and moist and mucous membranes are normal. No oropharyngeal exudate.  Eyes: Conjunctivae are normal. Right eye exhibits no discharge. Left eye exhibits no discharge. No scleral icterus.  Neck: Normal range of motion. Neck supple. No thyromegaly present.  Cardiovascular: Normal rate, regular rhythm, normal heart sounds and intact distal pulses.   Pulmonary/Chest: Effort normal and breath sounds normal. No respiratory distress.  Abdominal: Soft. Bowel sounds are normal. He exhibits no distension and no mass. There is no tenderness. There is no rebound and no guarding.  Genitourinary: Rectum normal and prostate normal. Rectal exam shows no mass and no tenderness. Prostate is not enlarged.  Musculoskeletal: He exhibits no edema.  Lymphadenopathy:    He has no cervical adenopathy.  Neurological: He is alert and oriented  to person, place, and time. He has normal reflexes. No cranial nerve deficit. He exhibits normal muscle tone.  Skin: Skin is warm and dry. No rash noted. He is not diaphoretic. No erythema.  Psychiatric: He has a normal mood and affect. His behavior is normal.          Assessment & Plan:   1. Annual physical exam   2. Screening for cardiovascular, respiratory, and genitourinary diseases   3. Screening for deficiency anemia   4. Screening for thyroid disorder   5. Essential hypertension   6. Encounter for medication management   7. Folliculitis     Orders Placed This Encounter  Procedures  . TSH  . Comprehensive metabolic panel  . Lipid panel  . CBC  . POCT  urinalysis dipstick    Meds ordered this encounter  Medications  . hydrochlorothiazide (HYDRODIURIL) 25 MG tablet    Sig: Take 1 tablet (25 mg total) by mouth daily.    Dispense:  90 tablet    Refill:  4  . atenolol (TENORMIN) 25 MG tablet    Sig: Take 1 tablet (25 mg total) by mouth daily.    Dispense:  90 tablet    Refill:  4  . amLODipine (NORVASC) 10 MG tablet    Sig: Take 1 tablet (10 mg total) by mouth daily.    Dispense:  90 tablet    Refill:  4  . triamcinolone cream (KENALOG) 0.1 %    Sig: Apply 1 application topically 2 (two) times daily.    Dispense:  80 g    Refill:  0     Norberto Sorenson, M.D.  [facility] [address] [place], [place] [address] [phone] phone [phone] fax  08/26/2015 8:59 AM

## 2015-08-26 ENCOUNTER — Ambulatory Visit (INDEPENDENT_AMBULATORY_CARE_PROVIDER_SITE_OTHER): Payer: BLUE CROSS/BLUE SHIELD | Admitting: Family Medicine

## 2015-08-26 ENCOUNTER — Encounter: Payer: Self-pay | Admitting: Family Medicine

## 2015-08-26 VITALS — BP 136/82 | HR 73 | Temp 98.4°F | Resp 18 | Ht 66.0 in | Wt 176.0 lb

## 2015-08-26 DIAGNOSIS — Z79899 Other long term (current) drug therapy: Secondary | ICD-10-CM | POA: Diagnosis not present

## 2015-08-26 DIAGNOSIS — Z1383 Encounter for screening for respiratory disorder NEC: Secondary | ICD-10-CM | POA: Diagnosis not present

## 2015-08-26 DIAGNOSIS — I10 Essential (primary) hypertension: Secondary | ICD-10-CM | POA: Diagnosis not present

## 2015-08-26 DIAGNOSIS — L739 Follicular disorder, unspecified: Secondary | ICD-10-CM | POA: Diagnosis not present

## 2015-08-26 DIAGNOSIS — Z1329 Encounter for screening for other suspected endocrine disorder: Secondary | ICD-10-CM

## 2015-08-26 DIAGNOSIS — Z1389 Encounter for screening for other disorder: Secondary | ICD-10-CM | POA: Diagnosis not present

## 2015-08-26 DIAGNOSIS — Z0000 Encounter for general adult medical examination without abnormal findings: Secondary | ICD-10-CM | POA: Diagnosis not present

## 2015-08-26 DIAGNOSIS — Z130 Encounter for screening for diseases of the blood and blood-forming organs and certain disorders involving the immune mechanism: Secondary | ICD-10-CM | POA: Diagnosis not present

## 2015-08-26 DIAGNOSIS — Z136 Encounter for screening for cardiovascular disorders: Secondary | ICD-10-CM

## 2015-08-26 LAB — CBC
HEMATOCRIT: 48 % (ref 38.5–50.0)
HEMOGLOBIN: 17.5 g/dL — AB (ref 13.2–17.1)
MCH: 31.4 pg (ref 27.0–33.0)
MCHC: 36.5 g/dL — AB (ref 32.0–36.0)
MCV: 86.2 fL (ref 80.0–100.0)
MPV: 9.8 fL (ref 7.5–12.5)
Platelets: 298 10*3/uL (ref 140–400)
RBC: 5.57 MIL/uL (ref 4.20–5.80)
RDW: 12.8 % (ref 11.0–15.0)
WBC: 6.8 10*3/uL (ref 3.8–10.8)

## 2015-08-26 LAB — LIPID PANEL
CHOL/HDL RATIO: 4.1 ratio (ref <=5.0)
CHOLESTEROL: 143 mg/dL (ref 125–200)
HDL: 35 mg/dL — AB (ref >=40)
LDL Cholesterol: 84 mg/dL (ref <130)
Triglycerides: 118 mg/dL (ref <150)
VLDL: 24 mg/dL (ref <30)

## 2015-08-26 LAB — POCT URINALYSIS DIP (MANUAL ENTRY)
BILIRUBIN UA: NEGATIVE
Bilirubin, UA: NEGATIVE
Blood, UA: NEGATIVE
Glucose, UA: NEGATIVE
LEUKOCYTES UA: NEGATIVE
Nitrite, UA: NEGATIVE
PROTEIN UA: NEGATIVE
Spec Grav, UA: 1.01
Urobilinogen, UA: 0.2
pH, UA: 6

## 2015-08-26 LAB — COMPREHENSIVE METABOLIC PANEL
ALBUMIN: 4.6 g/dL (ref 3.6–5.1)
ALK PHOS: 45 U/L (ref 40–115)
ALT: 31 U/L (ref 9–46)
AST: 21 U/L (ref 10–40)
BILIRUBIN TOTAL: 0.6 mg/dL (ref 0.2–1.2)
BUN: 11 mg/dL (ref 7–25)
CALCIUM: 9.3 mg/dL (ref 8.6–10.3)
CO2: 28 mmol/L (ref 20–31)
Chloride: 102 mmol/L (ref 98–110)
Creat: 0.81 mg/dL (ref 0.60–1.35)
GLUCOSE: 99 mg/dL (ref 65–99)
POTASSIUM: 3.7 mmol/L (ref 3.5–5.3)
Sodium: 140 mmol/L (ref 135–146)
TOTAL PROTEIN: 7.3 g/dL (ref 6.1–8.1)

## 2015-08-26 LAB — TSH: TSH: 2.74 m[IU]/L (ref 0.40–4.50)

## 2015-08-26 MED ORDER — AMLODIPINE BESYLATE 10 MG PO TABS: 10.0000 mg | ORAL_TABLET | Freq: Every day | ORAL | Status: DC

## 2015-08-26 MED ORDER — TRIAMCINOLONE ACETONIDE 0.1 % EX CREA: 1.0000 "application " | TOPICAL_CREAM | Freq: Two times a day (BID) | CUTANEOUS | Status: DC

## 2015-08-26 MED ORDER — HYDROCHLOROTHIAZIDE 25 MG PO TABS: 25.0000 mg | ORAL_TABLET | Freq: Every day | ORAL | Status: DC

## 2015-08-26 MED ORDER — ATENOLOL 25 MG PO TABS: 25.0000 mg | ORAL_TABLET | Freq: Every day | ORAL | Status: DC

## 2015-08-26 NOTE — Patient Instructions (Addendum)
  Try hibiclens body wash or dial soap in the shower for a week to help with the rash.   IF you received an x-ray today, you will receive an invoice from [facility]. Please contact [facility] at [phone] with questions or concerns regarding your invoice.   IF you received labwork today, you will receive an invoice from [facility]. Please contact [facility] at [phone] with questions or concerns regarding your invoice.   Our billing staff will not be able to assist you with questions regarding bills from these companies.  You will be contacted with the lab results as soon as they are available. The fastest way to get your results is to activate your My Chart account. Instructions are located on the last page of this paperwork. If you have not heard from us regarding the results in 2 weeks, please contact this office.    Folliculitis Folliculitis is redness, soreness, and swelling (inflammation) of the hair follicles. This condition can occur anywhere on the body. People with weakened immune systems, diabetes, or obesity have a greater risk of getting folliculitis. CAUSES  Bacterial infection. This is the most common cause.  Fungal infection.  Viral infection.  Contact with certain chemicals, especially oils and tars. Long-term folliculitis can result from bacteria that live in the nostrils. The bacteria may trigger multiple outbreaks of folliculitis over time. SYMPTOMS Folliculitis most commonly occurs on the scalp, thighs, legs, back, buttocks, and areas where hair is shaved frequently. An early sign of folliculitis is a small, white or yellow, pus-filled, itchy lesion (pustule). These lesions appear on a red, inflamed follicle. They are usually less than 0.2 inches (5 mm) wide. When there is an infection of the follicle that goes deeper, it becomes a boil or furuncle. A group of closely packed boils creates a larger lesion (carbuncle).  Carbuncles tend to occur in hairy, sweaty areas of the body. DIAGNOSIS  Your caregiver can usually tell what is wrong by doing a physical exam. A sample may be taken from one of the lesions and tested in a lab. This can help determine what is causing your folliculitis. TREATMENT  Treatment may include:  Applying warm compresses to the affected areas.  Taking antibiotic medicines orally or applying them to the skin.  Draining the lesions if they contain a large amount of pus or fluid.  Laser hair removal for cases of long-lasting folliculitis. This helps to prevent regrowth of the hair. HOME CARE INSTRUCTIONS  Apply warm compresses to the affected areas as directed by your caregiver.  If antibiotics are prescribed, take them as directed. Finish them even if you start to feel better.  You may take over-the-counter medicines to relieve itching.  Do not shave irritated skin.  Follow up with your caregiver as directed. SEEK IMMEDIATE MEDICAL CARE IF:   You have increasing redness, swelling, or pain in the affected area.  You have a fever. MAKE SURE YOU:  Understand these instructions.  Will watch your condition.  Will get help right away if you are not doing well or get worse.   This information is not intended to replace advice given to you by your health care provider. Make sure you discuss any questions you have with your health care provider.   Document Released: 04/03/2001 Document Revised: 02/13/2014 Document Reviewed: 04/25/2011 Elsevier Interactive Patient Education [employer]2016 Elsevier Inc.

## 2015-08-27 ENCOUNTER — Encounter: Payer: BLUE CROSS/BLUE SHIELD | Admitting: Family Medicine

## 2015-08-29 ENCOUNTER — Encounter: Payer: Self-pay | Admitting: Family Medicine

## 2015-10-15 ENCOUNTER — Telehealth: Payer: Self-pay

## 2015-10-15 NOTE — Telephone Encounter (Signed)
Atenolol is on national back order. Can you please send in a Rx for an alternative?

## 2015-10-18 MED ORDER — METOPROLOL SUCCINATE ER 25 MG PO TB24: 25.0000 mg | ORAL_TABLET | Freq: Every day | ORAL | 3 refills | Status: DC

## 2015-10-18 NOTE — Telephone Encounter (Signed)
D/c'd atenolol 25 and changed to toprol 25.
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
## 2016-04-13 ENCOUNTER — Encounter: Payer: Self-pay | Admitting: Family Medicine

## 2016-04-13 ENCOUNTER — Ambulatory Visit (INDEPENDENT_AMBULATORY_CARE_PROVIDER_SITE_OTHER): Payer: BLUE CROSS/BLUE SHIELD | Admitting: Family Medicine

## 2016-04-13 VITALS — BP 130/81 | HR 78 | Temp 99.1°F | Resp 18 | Ht 66.0 in | Wt 177.0 lb

## 2016-04-13 DIAGNOSIS — R0789 Other chest pain: Secondary | ICD-10-CM

## 2016-04-13 MED ORDER — MELOXICAM 7.5 MG PO TABS: 7.5000 mg | ORAL_TABLET | Freq: Every day | ORAL | 0 refills | Status: DC

## 2016-04-13 NOTE — Patient Instructions (Addendum)
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 Take the meloxicam every morning for 2 weeks and ice for 10-15 minutes 1-2  times a day. If any discomfort or tenderness persists come back so we can get an x-ray. Your xiphoid might always be prominent but as long as it is not causing any pain or discomfort you do not need to worry about it    IF you received an x-ray today, you will receive an invoice from [facility]. Please contact [facility] at [phone] with questions or concerns regarding your invoice.   IF you received labwork today, you will receive an invoice from [facility]. Please contact [facility] at [phone] with questions or concerns regarding your invoice.   Our billing staff will not be able to assist you with questions regarding bills from these companies.  You will be contacted with the lab results as soon as they are available. The fastest way to get your results is to activate your My Chart account. Instructions are located on the last page of this paperwork. If you have not heard from us regarding the results in 2 weeks, please contact this office.

## 2016-08-27 NOTE — Progress Notes (Signed)
Subjective:    Patient ID: Andre Webb, male    DOB: 1975/08/03, 41 y.o.   MRN: [id number] Chief Complaint  Patient presents with  . Annual Exam    HPI  Andre Webb is a delightful 41 yo male who is here today for his annual CPE.   Primary Preventative Screenings: Prostate Cancer:  STI screening: Declines need. In a married monogamous relationship. Colorectal Cancer: not yet indicated due to age. H/o tobacco use AAA/Lung: No history of any tobacco use. No alcohol or illicit drug use. Cardiac: Is taking omega-3 supp. Prior lipid panel has been well controlled with lifestyle. Was seen by cardiology Andre Webb at [facility] once in 2014 due to resistent HTN in a young otherwise healthy active male in his late 65s with no FHx. However, that clinical eval was benign and no concern for secondary cause was elucidated and as pt had responded well to the 3 drug regimen, no further evaluation was needed at that time.  That was last EKG. Weight/blood sugar: Family history of diabetes in father. OTC/vit/supp/herbal: rare ibuprofen Diet/Exercise: Tries to eat healthy but not all the time.  Walks for exercise about an hour a day and has a very active job.   Dentist/Optho: Immunizations:  Immunization History  Administered Date(s) Administered  . Influenza-Unspecified 10/08/2015  . Tdap 08/15/2013     HTN: On amlodipine 10, atenolol 25, and hctz 25. Had cough with both acei and arb. Checks BP at home.  BP 120s/80s.  Past Medical History:  Diagnosis Date  . Hypertension    History reviewed. No pertinent surgical history. Current Outpatient Prescriptions on File Prior to Visit  Medication Sig Dispense Refill  . aspirin EC 81 MG tablet Take 1 tablet (81 mg total) by mouth daily. 90 tablet 3  . ibuprofen (ADVIL,MOTRIN) 600 MG tablet Take 1 tablet (600 mg total) by mouth every 8 (eight) hours as needed for fever, headache or mild pain. 30 tablet 0   No current facility-administered  medications on file prior to visit.    Allergies  Allergen Reactions  . Lisinopril     Cough   . Losartan Cough   Family History  Problem Relation Age of Onset  . Diabetes Father    Social History   Social History  . Marital status: Married    Spouse name: N/A  . Number of children: N/A  . 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 Smoking status: Never Smoker  . Smokeless tobacco: Never Used  . Alcohol use No  . Drug use: No  . Sexual activity: No   Other Topics Concern  . None   Social History Narrative   Married. Education: [employer].   Depression screen [facility] 2/9 08/28/2016 04/13/2016 08/26/2015 08/21/2014 08/15/2013  Decreased Interest 0 0 0 0 0  Down, Depressed, Hopeless 0 0 0 0 0  PHQ - 2 Score 0 0 0 0 0     Review of Systems See hpi    Objective:   Physical Exam  Constitutional: He is oriented to person, place, and time. He appears well-developed and well-nourished. No distress.  HENT:  Head: Normocephalic and atraumatic.  Right Ear: Tympanic membrane, external ear and ear canal normal.  Left Ear: Tympanic membrane, external ear and ear canal normal.  Nose: Nose normal.  Mouth/Throat: Uvula is midline, oropharynx is clear and moist and mucous membranes are normal. No  oropharyngeal exudate.  Eyes: Conjunctivae are normal. Right eye exhibits no discharge. Left eye exhibits no discharge. No scleral icterus.  Neck: Normal range of motion. Neck supple. No thyromegaly present.  Cardiovascular: Normal rate, regular rhythm, normal heart sounds and intact distal pulses.   Pulmonary/Chest: Effort normal and breath sounds normal. No respiratory distress.  Abdominal: Soft. Bowel sounds are normal. He exhibits no distension and no mass. There is no tenderness. There is no rebound and no guarding.  Musculoskeletal: He exhibits no edema.  Lymphadenopathy:    He has no cervical adenopathy.    Neurological: He is alert and oriented to person, place, and time. He has normal reflexes. No cranial nerve deficit. He exhibits normal muscle tone.  Skin: Skin is warm and dry. No rash noted. He is not diaphoretic. No erythema.  Psychiatric: He has a normal mood and affect. His behavior is normal.         BP (!) 141/91   Pulse 65   Temp 98.3 F (36.8 C) (Oral)   Resp 18   Ht 5\' 6"  (1.676 m)   Wt 175 lb 6.4 oz (79.6 kg)   SpO2 97%   BMI 28.31 kg/m   [facility] reading (PRIMARY) by  Dr. Clelia CroftShaw. EKG: Assessment & Plan:  EKG  1. Annual physical exam   2. Screening for cardiovascular, respiratory, and genitourinary diseases   3. Screening for deficiency anemia   4. Screening for thyroid disorder   5. Essential hypertension   6. Overweight (BMI 25.0-29.9)   7. Medication monitoring encounter     Orders Placed This Encounter  Procedures  . CBC  . Comprehensive metabolic panel    Order Specific Question:   Has the patient fasted?    Answer:   Yes  . TSH  . Lipid panel    Order Specific Question:   Has the patient fasted?    Answer:   Yes  . Hemoglobin A1c  . Care order/instruction:    Scheduling Instructions:     Recheck BP  . Care order/instruction:    Scheduling Instructions:     Complete orders, AVS and go.  Marland Kitchen. POCT urinalysis dipstick  . EKG 12-Lead    Meds ordered this encounter  Medications  . hydrochlorothiazide (HYDRODIURIL) 25 MG tablet    Sig: Take 1 tablet (25 mg total) by mouth daily.    Dispense:  90 tablet    Refill:  4  . amLODipine (NORVASC) 10 MG tablet    Sig: Take 1 tablet (10 mg total) by mouth daily.    Dispense:  90 tablet    Refill:  4  . atenolol (TENORMIN) 25 MG tablet    Sig: Take 1 tablet (25 mg total) by mouth daily.    Dispense:  90 tablet    Refill:  4     Norberto SorensonEva Nyesha Cliff, M.D.  [facility]  [place] [address] [place], [place] [address] [phone] phone [phone] fax  08/30/16 9:25 PM

## 2016-08-28 ENCOUNTER — Ambulatory Visit (INDEPENDENT_AMBULATORY_CARE_PROVIDER_SITE_OTHER): Payer: BLUE CROSS/BLUE SHIELD | Admitting: Family Medicine

## 2016-08-28 ENCOUNTER — Encounter: Payer: Self-pay | Admitting: Family Medicine

## 2016-08-28 VITALS — BP 136/91 | HR 65 | Temp 98.3°F | Resp 18 | Ht 66.0 in | Wt 175.4 lb

## 2016-08-28 DIAGNOSIS — I10 Essential (primary) hypertension: Secondary | ICD-10-CM

## 2016-08-28 DIAGNOSIS — Z136 Encounter for screening for cardiovascular disorders: Secondary | ICD-10-CM | POA: Diagnosis not present

## 2016-08-28 DIAGNOSIS — Z1329 Encounter for screening for other suspected endocrine disorder: Secondary | ICD-10-CM | POA: Diagnosis not present

## 2016-08-28 DIAGNOSIS — E663 Overweight: Secondary | ICD-10-CM | POA: Diagnosis not present

## 2016-08-28 DIAGNOSIS — Z1389 Encounter for screening for other disorder: Secondary | ICD-10-CM | POA: Diagnosis not present

## 2016-08-28 DIAGNOSIS — Z5181 Encounter for therapeutic drug level monitoring: Secondary | ICD-10-CM | POA: Diagnosis not present

## 2016-08-28 DIAGNOSIS — Z0000 Encounter for general adult medical examination without abnormal findings: Secondary | ICD-10-CM

## 2016-08-28 DIAGNOSIS — Z1383 Encounter for screening for respiratory disorder NEC: Secondary | ICD-10-CM

## 2016-08-28 DIAGNOSIS — Z130 Encounter for screening for diseases of the blood and blood-forming organs and certain disorders involving the immune mechanism: Secondary | ICD-10-CM

## 2016-08-28 LAB — POCT URINALYSIS DIP (MANUAL ENTRY)
BILIRUBIN UA: NEGATIVE
Blood, UA: NEGATIVE
GLUCOSE UA: NEGATIVE mg/dL
Ketones, POC UA: NEGATIVE mg/dL
LEUKOCYTES UA: NEGATIVE
NITRITE UA: NEGATIVE
PH UA: 5.5 (ref 5.0–8.0)
Protein Ur, POC: NEGATIVE mg/dL
Spec Grav, UA: >=1.03 — AB (ref 1.010–1.025)
Urobilinogen, UA: 0.2 E.U./dL

## 2016-08-28 MED ORDER — ATENOLOL 25 MG PO TABS: 25.0000 mg | ORAL_TABLET | Freq: Every day | ORAL | 4 refills | Status: DC

## 2016-08-28 MED ORDER — HYDROCHLOROTHIAZIDE 25 MG PO TABS: 25.0000 mg | ORAL_TABLET | Freq: Every day | ORAL | 4 refills | Status: DC

## 2016-08-28 MED ORDER — AMLODIPINE BESYLATE 10 MG PO TABS: 10.0000 mg | ORAL_TABLET | Freq: Every day | ORAL | 4 refills | Status: DC

## 2016-08-28 NOTE — Patient Instructions (Addendum)
   IF you received an x-ray today, you will receive an invoice from [facility]. Please contact [facility] at [phone] with questions or concerns regarding your invoice.   IF you received labwork today, you will receive an invoice from [facility]. Please contact [facility] at [phone] with questions or concerns regarding your invoice.   Our billing staff will not be able to assist you with questions regarding bills from these companies.  You will be contacted with the lab results as soon as they are available. The fastest way to get your results is to activate your My Chart account. Instructions are located on the last page of this paperwork. If you have not heard from us regarding the results in 2 weeks, please contact this office.     DASH Eating Plan DASH stands for "Dietary Approaches to Stop Hypertension." The DASH eating plan is a healthy eating plan that has been shown to reduce high blood pressure (hypertension). It may also reduce your risk for type 2 diabetes, heart disease, and stroke. The DASH eating plan may also help with weight loss. What are tips for following this plan? General guidelines   Avoid eating more than 2,300 mg (milligrams) of salt (sodium) a day. If you have hypertension, you may need to reduce your sodium intake to 1,500 mg a day.  Limit alcohol intake to no more than 1 drink a day for nonpregnant women and 2 drinks a day for men. One drink equals 12 oz of beer, 5 oz of wine, or 1 oz of hard liquor.  Work with your health care provider to maintain a healthy body weight or to lose weight. Ask what an ideal weight is for you.  Get at least 30 minutes of exercise that causes your heart to beat faster (aerobic exercise) most days of the week. Activities may include walking, swimming, or biking.  Work with your health care provider or diet and nutrition specialist (dietitian) to adjust your eating plan to your individual calorie  needs. Reading food labels   Check food labels for the amount of sodium per serving. Choose foods with less than 5 percent of the Daily Value of sodium. Generally, foods with less than 300 mg of sodium per serving fit into this eating plan.  To find whole grains, look for the word "whole" as the first word in the ingredient list. Shopping   Buy products labeled as "low-sodium" or "no salt added."  Buy fresh foods. Avoid canned foods and premade or frozen meals. Cooking   Avoid adding salt when cooking. Use salt-free seasonings or herbs instead of table salt or sea salt. Check with your health care provider or pharmacist before using salt substitutes.  Do not fry foods. Cook foods using healthy methods such as baking, boiling, grilling, and broiling instead.  Cook with heart-healthy oils, such as olive, canola, soybean, or sunflower oil. Meal planning    Eat a balanced diet that includes:  5 or more servings of fruits and vegetables each day. At each meal, try to fill half of your plate with fruits and vegetables.  Up to 6-8 servings of whole grains each day.  Less than 6 oz of lean meat, poultry, or fish each day. A 3-oz serving of meat is about the same size as a deck of cards. One egg equals 1 oz.  2 servings of low-fat dairy each day.  A serving of nuts, seeds, or beans 5 times each week.  Heart-healthy fats. Healthy fats called   Omega-3 fatty acids are found in foods such as flaxseeds and coldwater fish, like sardines, salmon, and mackerel.  Limit how much you eat of the following:  Canned or prepackaged foods.  Food that is high in trans fat, such as fried foods.  Food that is high in saturated fat, such as fatty meat.  Sweets, desserts, sugary drinks, and other foods with added sugar.  Full-fat dairy products.  Do not salt foods before eating.  Try to eat at least 2 vegetarian meals each week.  Eat more home-cooked food and less restaurant, buffet, and fast  food.  When eating at a restaurant, ask that your food be prepared with less salt or no salt, if possible. What foods are recommended? The items listed may not be a complete list. Talk with your dietitian about what dietary choices are best for you. Grains  Whole-grain or whole-wheat bread. Whole-grain or whole-wheat pasta. Brown rice. Oatmeal. Quinoa. Bulgur. Whole-grain and low-sodium cereals. Pita bread. Low-fat, low-sodium crackers. Whole-wheat flour tortillas. Vegetables  Fresh or frozen vegetables (raw, steamed, roasted, or grilled). Low-sodium or reduced-sodium tomato and vegetable juice. Low-sodium or reduced-sodium tomato sauce and tomato paste. Low-sodium or reduced-sodium canned vegetables. Fruits  All fresh, dried, or frozen fruit. Canned fruit in natural juice (without added sugar). Meat and other protein foods  Skinless chicken or turkey. Ground chicken or turkey. Pork with fat trimmed off. Fish and seafood. Egg whites. Dried beans, peas, or lentils. Unsalted nuts, nut butters, and seeds. Unsalted canned beans. Lean cuts of beef with fat trimmed off. Low-sodium, lean deli meat. Dairy  Low-fat (1%) or fat-free (skim) milk. Fat-free, low-fat, or reduced-fat cheeses. Nonfat, low-sodium ricotta or cottage cheese. Low-fat or nonfat yogurt. Low-fat, low-sodium cheese. Fats and oils  Soft margarine without trans fats. Vegetable oil. Low-fat, reduced-fat, or light mayonnaise and salad dressings (reduced-sodium). Canola, safflower, olive, soybean, and sunflower oils. Avocado. Seasoning and other foods  Herbs. Spices. Seasoning mixes without salt. Unsalted popcorn and pretzels. Fat-free sweets. What foods are not recommended? The items listed may not be a complete list. Talk with your dietitian about what dietary choices are best for you. Grains  Baked goods made with fat, such as croissants, muffins, or some breads. Dry pasta or rice meal packs. Vegetables  Creamed or fried vegetables.  Vegetables in a cheese sauce. Regular canned vegetables (not low-sodium or reduced-sodium). Regular canned tomato sauce and paste (not low-sodium or reduced-sodium). Regular tomato and vegetable juice (not low-sodium or reduced-sodium). Pickles. Olives. Fruits  Canned fruit in a light or heavy syrup. Fried fruit. Fruit in cream or butter sauce. Meat and other protein foods  Fatty cuts of meat. Ribs. Fried meat. Bacon. Sausage. Bologna and other processed lunch meats. Salami. Fatback. Hotdogs. Bratwurst. Salted nuts and seeds. Canned beans with added salt. Canned or smoked fish. Whole eggs or egg yolks. Chicken or turkey with skin. Dairy  Whole or 2% milk, cream, and half-and-half. Whole or full-fat cream cheese. Whole-fat or sweetened yogurt. Full-fat cheese. Nondairy creamers. Whipped toppings. Processed cheese and cheese spreads. Fats and oils  Butter. Stick margarine. Lard. Shortening. Ghee. Bacon fat. Tropical oils, such as coconut, palm kernel, or palm oil. Seasoning and other foods  Salted popcorn and pretzels. Onion salt, garlic salt, seasoned salt, table salt, and sea salt. Worcestershire sauce. Tartar sauce. Barbecue sauce. Teriyaki sauce. Soy sauce, including reduced-sodium. Steak sauce. Canned and packaged gravies. Fish sauce. Oyster sauce. Cocktail sauce. Horseradish that you find on the shelf. Ketchup. Mustard. Meat flavorings   and tenderizers. Bouillon cubes. Hot sauce and Tabasco sauce. Premade or packaged marinades. Premade or packaged taco seasonings. Relishes. Regular salad dressings. Where to find more information:  National Heart, Lung, and Blood Institute: www.nhlbi.nih.gov  American Heart Association: www.heart.org Summary  The DASH eating plan is a healthy eating plan that has been shown to reduce high blood pressure (hypertension). It may also reduce your risk for type 2 diabetes, heart disease, and stroke.  With the DASH eating plan, you should limit salt (sodium) intake  to 2,300 mg a day. If you have hypertension, you may need to reduce your sodium intake to 1,500 mg a day.  When on the DASH eating plan, aim to eat more fresh fruits and vegetables, whole grains, lean proteins, low-fat dairy, and heart-healthy fats.  Work with your health care provider or diet and nutrition specialist (dietitian) to adjust your eating plan to your individual calorie needs. This information is not intended to replace advice given to you by your health care provider. Make sure you discuss any questions you have with your health care provider. Document Released: 01/12/2011 Document Revised: 01/17/2016 Document Reviewed: 01/17/2016 Elsevier Interactive Patient Education  2017 Elsevier Inc.  

## 2016-08-29 ENCOUNTER — Telehealth: Payer: Self-pay

## 2016-08-29 LAB — HEMOGLOBIN A1C
Est. average glucose Bld gHb Est-mCnc: 108 mg/dL
HEMOGLOBIN A1C: 5.4 % (ref 4.8–5.6)

## 2016-08-29 LAB — COMPREHENSIVE METABOLIC PANEL
A/G RATIO: 1.8 (ref 1.2–2.2)
ALT: 37 IU/L (ref 0–44)
AST: 28 IU/L (ref 0–40)
Albumin: 5 g/dL (ref 3.5–5.5)
Alkaline Phosphatase: 61 IU/L (ref 39–117)
BILIRUBIN TOTAL: 0.6 mg/dL (ref 0.0–1.2)
BUN / CREAT RATIO: 16 (ref 9–20)
BUN: 13 mg/dL (ref 6–24)
CALCIUM: 10.3 mg/dL — AB (ref 8.7–10.2)
CO2: 24 mmol/L (ref 20–29)
Chloride: 97 mmol/L (ref 96–106)
Creatinine, Ser: 0.83 mg/dL (ref 0.76–1.27)
GFR, EST AFRICAN AMERICAN: 125 mL/min/{1.73_m2} (ref >59)
GFR, EST NON AFRICAN AMERICAN: 109 mL/min/{1.73_m2} (ref >59)
GLOBULIN, TOTAL: 2.8 g/dL (ref 1.5–4.5)
Glucose: 97 mg/dL (ref 65–99)
POTASSIUM: 3.5 mmol/L (ref 3.5–5.2)
SODIUM: 141 mmol/L (ref 134–144)
TOTAL PROTEIN: 7.8 g/dL (ref 6.0–8.5)

## 2016-08-29 LAB — CBC
HEMATOCRIT: 49.8 % (ref 37.5–51.0)
Hemoglobin: 18 g/dL — ABNORMAL HIGH (ref 13.0–17.7)
MCH: 31.1 pg (ref 26.6–33.0)
MCHC: 36.1 g/dL — ABNORMAL HIGH (ref 31.5–35.7)
MCV: 86 fL (ref 79–97)
Platelets: 307 10*3/uL (ref 150–379)
RBC: 5.78 x10E6/uL (ref 4.14–5.80)
RDW: 13.6 % (ref 12.3–15.4)
WBC: 6.4 10*3/uL (ref 3.4–10.8)

## 2016-08-29 LAB — LIPID PANEL
CHOL/HDL RATIO: 4.5 ratio (ref 0.0–5.0)
Cholesterol, Total: 176 mg/dL (ref 100–199)
HDL: 39 mg/dL — ABNORMAL LOW (ref >39)
LDL CALC: 119 mg/dL — AB (ref 0–99)
Triglycerides: 90 mg/dL (ref 0–149)
VLDL Cholesterol Cal: 18 mg/dL (ref 5–40)

## 2016-08-29 LAB — TSH: TSH: 2.52 u[IU]/mL (ref 0.450–4.500)

## 2016-08-29 NOTE — Telephone Encounter (Signed)
LM on VM that the form that he left with us to fill out from his CPE are completed and ready for him to pick up.

## 2017-01-05 ENCOUNTER — Telehealth: Payer: Self-pay | Admitting: Family Medicine

## 2017-01-05 NOTE — Telephone Encounter (Signed)
Left message for pt to contact pharmacy for refills. Refills still available at [facility].

## 2017-01-05 NOTE — Telephone Encounter (Signed)
Copied from CRM [phone]. Topic: Quick Communication - See Telephone Encounter >> Jan 05, 2017 10:09 AM Oneal GroutSebastian, Jennifer S wrote: CRM for notification. See Telephone encounter for:  Requesting refill on atenolol (TENORMIN) 25 MG tablet, [facility]

## 2017-08-30 ENCOUNTER — Encounter: Payer: Self-pay | Admitting: Family Medicine

## 2017-08-30 ENCOUNTER — Ambulatory Visit (INDEPENDENT_AMBULATORY_CARE_PROVIDER_SITE_OTHER): Payer: BLUE CROSS/BLUE SHIELD

## 2017-08-30 ENCOUNTER — Ambulatory Visit (INDEPENDENT_AMBULATORY_CARE_PROVIDER_SITE_OTHER): Payer: BLUE CROSS/BLUE SHIELD | Admitting: Family Medicine

## 2017-08-30 ENCOUNTER — Other Ambulatory Visit: Payer: Self-pay

## 2017-08-30 VITALS — BP 110/72 | HR 58 | Temp 97.6°F | Ht 66.0 in | Wt 175.8 lb

## 2017-08-30 DIAGNOSIS — Z136 Encounter for screening for cardiovascular disorders: Secondary | ICD-10-CM | POA: Diagnosis not present

## 2017-08-30 DIAGNOSIS — Z130 Encounter for screening for diseases of the blood and blood-forming organs and certain disorders involving the immune mechanism: Secondary | ICD-10-CM

## 2017-08-30 DIAGNOSIS — R05 Cough: Secondary | ICD-10-CM

## 2017-08-30 DIAGNOSIS — R058 Other specified cough: Secondary | ICD-10-CM

## 2017-08-30 DIAGNOSIS — D751 Secondary polycythemia: Secondary | ICD-10-CM | POA: Diagnosis not present

## 2017-08-30 DIAGNOSIS — R9389 Abnormal findings on diagnostic imaging of other specified body structures: Secondary | ICD-10-CM

## 2017-08-30 DIAGNOSIS — I10 Essential (primary) hypertension: Secondary | ICD-10-CM

## 2017-08-30 DIAGNOSIS — Z0000 Encounter for general adult medical examination without abnormal findings: Secondary | ICD-10-CM

## 2017-08-30 DIAGNOSIS — Z1383 Encounter for screening for respiratory disorder NEC: Secondary | ICD-10-CM

## 2017-08-30 DIAGNOSIS — Z1329 Encounter for screening for other suspected endocrine disorder: Secondary | ICD-10-CM

## 2017-08-30 DIAGNOSIS — Z1389 Encounter for screening for other disorder: Secondary | ICD-10-CM

## 2017-08-30 LAB — POCT CBC
Granulocyte percent: 59.8 %G (ref 37–80)
HCT, POC: 51.4 % (ref 43.5–53.7)
HEMOGLOBIN: 17.3 g/dL (ref 14.1–18.1)
Lymph, poc: 2 (ref 0.6–3.4)
MCH: 29.9 pg (ref 27–31.2)
MCHC: 33.6 g/dL (ref 31.8–35.4)
MCV: 88.8 fL (ref 80–97)
MID (cbc): 0.4 (ref 0–0.9)
MPV: 7.6 fL (ref 0–99.8)
POC Granulocyte: 3.5 (ref 2–6.9)
POC LYMPH PERCENT: 33.8 %L (ref 10–50)
POC MID %: 6.4 %M (ref 0–12)
Platelet Count, POC: 330 10*3/uL (ref 142–424)
RBC: 5.78 M/uL (ref 4.69–6.13)
RDW, POC: 12.6 %
WBC: 5.8 10*3/uL (ref 4.6–10.2)

## 2017-08-30 LAB — POCT URINALYSIS DIP (MANUAL ENTRY)
Bilirubin, UA: NEGATIVE
Blood, UA: NEGATIVE
GLUCOSE UA: NEGATIVE mg/dL
Ketones, POC UA: NEGATIVE mg/dL
Leukocytes, UA: NEGATIVE
Nitrite, UA: NEGATIVE
PROTEIN UA: NEGATIVE mg/dL
SPEC GRAV UA: 1.015 (ref 1.010–1.025)
Urobilinogen, UA: 0.2 E.U./dL
pH, UA: 7 (ref 5.0–8.0)

## 2017-08-30 MED ORDER — FLUTICASONE PROPIONATE 50 MCG/ACT NA SUSP: 2.0000 | Freq: Every day | NASAL | 2 refills | Status: DC

## 2017-08-30 MED ORDER — HYDROCHLOROTHIAZIDE 25 MG PO TABS: 25.0000 mg | ORAL_TABLET | Freq: Every day | ORAL | 4 refills | Status: DC

## 2017-08-30 MED ORDER — ATENOLOL 25 MG PO TABS: 25.0000 mg | ORAL_TABLET | Freq: Every day | ORAL | 4 refills | Status: DC

## 2017-08-30 MED ORDER — AMLODIPINE BESYLATE 10 MG PO TABS: 10.0000 mg | ORAL_TABLET | Freq: Every day | ORAL | 4 refills | Status: DC

## 2017-08-30 NOTE — Patient Instructions (Addendum)
   IF you received an x-ray today, you will receive an invoice from [facility]. Please contact [facility] at [phone] with questions or concerns regarding your invoice.   IF you received labwork today, you will receive an invoice from [facility]. Please contact [facility] at [phone] with questions or concerns regarding your invoice.   Our billing staff will not be able to assist you with questions regarding bills from these companies.  You will be contacted with the lab results as soon as they are available. The fastest way to get your results is to activate your My Chart account. Instructions are located on the last page of this paperwork. If you have not heard from us regarding the results in 2 weeks, please contact this office.     Keeping you healthy  Get these tests  Blood pressure- Have your blood pressure checked once a year by your healthcare provider.  Normal blood pressure is 120/80.  Weight- Have your body mass index (BMI) calculated to screen for obesity.  BMI is a measure of body fat based on height and weight. You can also calculate your own BMI at www.nhlbisupport.com/bmi/.  Cholesterol- Have your cholesterol checked regularly starting at age 35, sooner may be necessary if you have diabetes, high blood pressure, if a family member developed heart diseases at an early age or if you smoke.   Chlamydia, HIV, and other sexual transmitted disease- Get screened each year until the age of 25 then within three months of each new sexual partner.  Diabetes- Have your blood sugar checked regularly if you have high blood pressure, high cholesterol, a family history of diabetes or if you are overweight.  Get these vaccines  Flu shot- Every fall.  Tetanus shot- Every 10 years.  Menactra- Single dose; prevents meningitis.  Take these steps  Don't smoke- If you do smoke, ask your healthcare provider about quitting. For tips on how to quit, go to  www.smokefree.gov or call 1-800-QUIT-NOW.  Be physically active- Exercise 5 days a week for at least 30 minutes.  If you are not already physically active start slow and gradually work up to 30 minutes of moderate physical activity.  Examples of moderate activity include walking briskly, mowing the yard, dancing, swimming bicycling, etc.  Eat a healthy diet- Eat a variety of healthy foods such as fruits, vegetables, low fat milk, low fat cheese, yogurt, lean meats, poultry, fish, beans, tofu, etc.  For more information on healthy eating, go to www.thenutritionsource.org  Drink alcohol in moderation- Limit alcohol intake two drinks or less a day.  Never drink and drive.  Dentist- Brush and floss teeth twice daily; visit your dentis twice a year.  Depression-Your emotional health is as important as your physical health.  If you're feeling down, losing interest in things you normally enjoy please talk with your healthcare provider.  Gun Safety- If you keep a gun in your home, keep it unloaded and with the safety lock on.  Bullets should be stored separately.  Helmet use- Always wear a helmet when riding a motorcycle, bicycle, rollerblading or skateboarding.  Safe sex- If you may be exposed to a sexually transmitted infection, use a condom  Seat belts- Seat bels can save your life; always wear one.  Smoke/Carbon Monoxide detectors- These detectors need to be installed on the appropriate level of your home.  Replace batteries at least once a year.  Skin Cancer- When out in the sun, cover up and use sunscreen SPF 15 or   higher.  Violence- If anyone is threatening or hurting you, please tell your healthcare provider.   Cough, Adult Coughing is a reflex that clears your throat and your airways. Coughing helps to heal and protect your lungs. It is normal to cough occasionally, but a cough that happens with other symptoms or lasts a long time may be a sign of a condition that needs treatment. A  cough may last only 2-3 weeks (acute), or it may last longer than 8 weeks (chronic). What are the causes? Coughing is commonly caused by:  Breathing in substances that irritate your lungs.  A viral or bacterial respiratory infection.  Allergies.  Asthma.  Postnasal drip.  Smoking.  Acid backing up from the stomach into the esophagus (gastroesophageal reflux).  Certain medicines.  Chronic lung problems, including COPD (or rarely, lung cancer).  Other medical conditions such as heart failure.  Follow these instructions at home: Pay attention to any changes in your symptoms. Take these actions to help with your discomfort:  Take medicines only as told by your health care provider. ? If you were prescribed an antibiotic medicine, take it as told by your health care provider. Do not stop taking the antibiotic even if you start to feel better. ? Talk with your health care provider before you take a cough suppressant medicine.  Drink enough fluid to keep your urine clear or pale yellow.  If the air is dry, use a cold steam vaporizer or humidifier in your bedroom or your home to help loosen secretions.  Avoid anything that causes you to cough at work or at home.  If your cough is worse at night, try sleeping in a semi-upright position.  Avoid cigarette smoke. If you smoke, quit smoking. If you need help quitting, ask your health care provider.  Avoid caffeine.  Avoid alcohol.  Rest as needed.  Contact a health care provider if:  You have new symptoms.  You cough up pus.  Your cough does not get better after 2-3 weeks, or your cough gets worse.  You cannot control your cough with suppressant medicines and you are losing sleep.  You develop pain that is getting worse or pain that is not controlled with pain medicines.  You have a fever.  You have unexplained weight loss.  You have night sweats. Get help right away if:  You cough up blood.  You have difficulty  breathing.  Your heartbeat is very fast. This information is not intended to replace advice given to you by your health care provider. Make sure you discuss any questions you have with your health care provider. Document Released: 07/22/2010 Document Revised: 07/01/2015 Document Reviewed: 04/01/2014 Elsevier Interactive Patient Education  [employer]2018 Elsevier Inc.

## 2017-08-30 NOTE — Progress Notes (Addendum)
Subjective:  By signing my name below, I, Stann Ore, attest that this documentation has been prepared under the direction and in the presence of Norberto Sorenson, MD. Electronically Signed: Stann Ore, Scribe. 08/30/2017 , 9:08 AM .  Patient was seen in Room 2 .   Patient ID: Andre Webb, male    DOB: 01-08-1976, 42 y.o.   MRN: [id number] Chief Complaint  Patient presents with  . Annual Exam    pt here to have a physical, he has a form that needs to be filled out. Pt is fasting   HPI  He is fasting today.   Primary Preventative Screenings: Prostate Cancer: STI screening:  Colorectal Cancer: Tobacco use/AAA/Lung/EtOH/Illicit substances:  Cardiac: Weight/Blood sugar/Diet/Exercise: BMI Readings from Last 3 Encounters:  08/30/17 28.37 kg/m  08/28/16 28.31 kg/m  04/13/16 28.57 kg/m   Lab Results  Component Value Date   HGBA1C 5.4 08/28/2016   OTC/Vit/Supp/Herbal: Dentist/Optho: Immunizations:  Immunization History  Administered Date(s) Administered  . Influenza-Unspecified 10/08/2015  . Tdap 08/15/2013    Chronic Medical Conditions: Dizziness: still has some dizziness if he gets up or stands up too quickly.   Palpitations: feels having some palpitations when he exerts himself, which resolves with rest. He denies any chest tightness, cold sweats or nausea. He denies this occurring more frequently recently.   Cough: He noticed starting about 10 days ago. He hasn't tried any OTC medications for this. He denies nasal congestion, productive cough, sore throat or indigestion.   Past Medical History:  Diagnosis Date  . Hypertension    No past surgical history on file. Current Outpatient Medications on File Prior to Visit  Medication Sig Dispense Refill  . amLODipine (NORVASC) 10 MG tablet Take 1 tablet (10 mg total) by mouth daily. 90 tablet 4  . aspirin EC 81 MG tablet Take 1 tablet (81 mg total) by mouth daily. 90 tablet 3  . atenolol (TENORMIN) 25 MG tablet Take  1 tablet (25 mg total) by mouth daily. 90 tablet 4  . hydrochlorothiazide (HYDRODIURIL) 25 MG tablet Take 1 tablet (25 mg total) by mouth daily. 90 tablet 4  . ibuprofen (ADVIL,MOTRIN) 600 MG tablet Take 1 tablet (600 mg total) by mouth every 8 (eight) hours as needed for fever, headache or mild pain. 30 tablet 0   No current facility-administered medications on file prior to visit.    Allergies  Allergen Reactions  . Lisinopril     Cough   . Losartan Cough   Family History  Problem Relation Age of Onset  . Diabetes Father    Social History   Socioeconomic History  . Marital status: Married    Spouse name: Not on file  . Number of children: Not on file  . Years of education: Not on file  . Highest education level: Not on file  Occupational History  . Occupation: Nutritional therapist: [employer]  Social Needs  . Financial resource strain: Not on file  . Food insecurity:    Worry: Not on file    Inability: Not on file  . Transportation needs:    Medical: Not on file    Non-medical: Not on file  Tobacco Use  . Smoking status: Never Smoker  . Smokeless tobacco: Never Used  Substance and Sexual Activity  . Alcohol use: No    Alcohol/week: 0.0 oz  . Drug use: No  . Sexual activity: Never    Birth control/protection: Abstinence  Lifestyle  . Physical activity:  Days per week: Not on file    Minutes per session: Not on file  . Stress: Not on file  Relationships  . Social connections:    Talks on phone: Not on file    Gets together: Not on file    Attends religious service: Not on file    Active member of club or organization: Not on file    Attends meetings of clubs or organizations: Not on file    Relationship status: Not on file  Other Topics Concern  . Not on file  Social History Narrative   Married. Education: [employer].   Depression screen [facility] 2/9 08/30/2017 08/28/2016 04/13/2016 08/26/2015 08/21/2014  Decreased Interest 0 0 0 0 0  Down, Depressed,  Hopeless 0 0 0 0 0  PHQ - 2 Score 0 0 0 0 0   Review of Systems  Constitutional: Negative for fatigue and unexpected weight change.  HENT: Negative for sore throat.   Eyes: Negative for visual disturbance.  Respiratory: Positive for cough. Negative for chest tightness and shortness of breath.   Cardiovascular: Positive for palpitations. Negative for chest pain and leg swelling.  Gastrointestinal: Negative for abdominal pain, blood in stool and nausea.  Neurological: Positive for dizziness. Negative for light-headedness and headaches.  All other systems reviewed and are negative.      Objective:   Physical Exam  Constitutional: He is oriented to person, place, and time. He appears well-developed and well-nourished. No distress.  HENT:  Head: Normocephalic and atraumatic.  Right Ear: Tympanic membrane is injected.  Left Ear: Tympanic membrane is injected.  Nose: Nose normal.  Mouth/Throat: Oropharynx is clear and moist.  Eyes: Pupils are equal, round, and reactive to light. EOM are normal.  Neck: Neck supple.  Cardiovascular: Normal rate, regular rhythm, S1 normal and S2 normal. Exam reveals no gallop and no friction rub.  No murmur heard. Pulmonary/Chest: Effort normal and breath sounds normal. No respiratory distress.  Abdominal: Bowel sounds are normal. There is no tenderness.  Musculoskeletal: Normal range of motion.  Neurological: He is alert and oriented to person, place, and time.  Skin: Skin is warm and dry.  Psychiatric: He has a normal mood and affect. His behavior is normal.  Nursing note and vitals reviewed.   BP 110/72 (BP Location: Left Arm, Patient Position: Sitting, Cuff Size: Normal)   Pulse (!) 58   Temp 97.6 F (36.4 C) (Oral)   Ht 5\' 6"  (1.676 m)   Wt 175 lb 12.8 oz (79.7 kg)   SpO2 98%   BMI 28.37 kg/m    Visual Acuity Screening   Right eye Left eye Both eyes  Without correction: 20/20 -1 20/25 20/20 -2  With correction:         CLINICAL DATA:   Cough at night. Pleuroparenchymal scarring on prior imaging  EXAM: CHEST - 2 VIEW  COMPARISON:  06/26/2012 chest CT  FINDINGS: Normal heart size and mediastinal contours. Multiple calcified nodules bilaterally. Fine interstitial opacity at the right apex. There is no edema, consolidation, effusion, or pneumothorax. No acute osseous finding.  IMPRESSION: 1. No evidence of acute disease. 2. 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   Electronically Signed   By: Marnee Spring M.D.   On: 08/30/2017 09:45  Results for orders placed or performed in visit on 08/30/17  POCT urinalysis dipstick  Result Value Ref Range   Color, UA yellow yellow   Clarity, UA clear clear   Glucose,  UA negative negative mg/dL   Bilirubin, UA negative negative   Ketones, POC UA negative negative mg/dL   Spec Grav, UA 4.098 1.191 - 1.025   Blood, UA negative negative   pH, UA 7.0 5.0 - 8.0   Protein Ur, POC negative negative mg/dL   Urobilinogen, UA 0.2 0.2 or 1.0 E.U./dL   Nitrite, UA Negative Negative   Leukocytes, UA Negative Negative  POCT CBC  Result Value Ref Range   WBC 5.8 4.6 - 10.2 K/uL   Lymph, poc 2.0 0.6 - 3.4   POC LYMPH PERCENT 33.8 10 - 50 %L   MID (cbc) 0.4 0 - 0.9   POC MID % 6.4 0 - 12 %M   POC Granulocyte 3.5 2 - 6.9   Granulocyte percent 59.8 37 - 80 %G   RBC 5.78 4.69 - 6.13 M/uL   Hemoglobin 17.3 14.1 - 18.1 g/dL   HCT, POC 47.8 29.5 - 53.7 %   MCV 88.8 80 - 97 fL   MCH, POC 29.9 27 - 31.2 pg   MCHC 33.6 31.8 - 35.4 g/dL   RDW, POC 62.1 %   Platelet Count, POC 330 142 - 424 K/uL   MPV 7.6 0 - 99.8 fL    Assessment & Plan:   1. Annual physical exam   2. Screening for cardiovascular, respiratory, and genitourinary diseases - cont asa 81 for primary prevention  3. Screening for deficiency anemia   4. Screening for thyroid disorder   5. Polycythemia, secondary - resolved on labs today  6. Hypercalcemia -If calcium is still  elevated, start work up with add on PTH. However, no needed - resolved on labs today - ca normal.  7. Essential hypertension - BP very well controlled - cont current regimen of amlodipine 10, atenolol 25, and hctz 25.  8. Nocturnal cough - not on ace/arb - start trial of flonase qhs  9. Abnormal chest x-ray - stable from prior - no further eval needed at this time -     Orders Placed This Encounter  Procedures  . DG Chest 2 View    Standing Status:   Future    Number of Occurrences:   1    Standing Expiration Date:   08/30/2018    Order Specific Question:   Reason for Exam (SYMPTOM  OR DIAGNOSIS REQUIRED)    Answer:   cough at night, h/o pleural and parenchymal scarring on last imaging 5 yrs ago, recheck for progressing    Order Specific Question:   Preferred imaging location?    Answer:   External  . Lipid Panel  . Comprehensive metabolic panel  . POCT urinalysis dipstick  . POCT CBC    Meds ordered this encounter  Medications  . fluticasone (FLONASE) 50 MCG/ACT nasal spray    Sig: Place 2 sprays into both nostrils at bedtime. X 2 weeks then as needed to treat nocturnal cough    Dispense:  16 g    Refill:  2  . amLODipine (NORVASC) 10 MG tablet    Sig: Take 1 tablet (10 mg total) by mouth daily.    Dispense:  90 tablet    Refill:  4  . atenolol (TENORMIN) 25 MG tablet    Sig: Take 1 tablet (25 mg total) by mouth daily.    Dispense:  90 tablet    Refill:  4  . hydrochlorothiazide (HYDRODIURIL) 25 MG tablet    Sig: Take 1 tablet (25 mg total) by mouth daily.  Dispense:  90 tablet    Refill:  4   I personally performed the services described in this documentation, which was scribed in my presence. The recorded information has been reviewed and considered, and addended by me as needed.   Norberto SorensonEva Danny Zimny, M.D.  [facility] at [facility]  [facility] [address] [place], [place] [id number] [phone] phone [phone] fax  11/03/17 5:09 PM

## 2017-08-31 LAB — LIPID PANEL
CHOLESTEROL TOTAL: 161 mg/dL (ref 100–199)
Chol/HDL Ratio: 4.6 ratio (ref 0.0–5.0)
HDL: 35 mg/dL — AB (ref >39)
LDL Calculated: 104 mg/dL — ABNORMAL HIGH (ref 0–99)
Triglycerides: 109 mg/dL (ref 0–149)
VLDL CHOLESTEROL CAL: 22 mg/dL (ref 5–40)

## 2017-08-31 LAB — COMPREHENSIVE METABOLIC PANEL
A/G RATIO: 2.1 (ref 1.2–2.2)
ALBUMIN: 5.2 g/dL (ref 3.5–5.5)
ALT: 39 IU/L (ref 0–44)
AST: 27 IU/L (ref 0–40)
Alkaline Phosphatase: 59 IU/L (ref 39–117)
BILIRUBIN TOTAL: 0.7 mg/dL (ref 0.0–1.2)
BUN / CREAT RATIO: 15 (ref 9–20)
BUN: 12 mg/dL (ref 6–24)
CALCIUM: 10 mg/dL (ref 8.7–10.2)
CO2: 24 mmol/L (ref 20–29)
CREATININE: 0.81 mg/dL (ref 0.76–1.27)
Chloride: 99 mmol/L (ref 96–106)
GFR calc Af Amer: 126 mL/min/{1.73_m2} (ref >59)
GFR calc non Af Amer: 109 mL/min/{1.73_m2} (ref >59)
Globulin, Total: 2.5 g/dL (ref 1.5–4.5)
Glucose: 105 mg/dL — ABNORMAL HIGH (ref 65–99)
Potassium: 3.8 mmol/L (ref 3.5–5.2)
SODIUM: 142 mmol/L (ref 134–144)
TOTAL PROTEIN: 7.7 g/dL (ref 6.0–8.5)

## 2017-11-03 ENCOUNTER — Encounter: Payer: Self-pay | Admitting: Family Medicine

## 2017-11-24 ENCOUNTER — Other Ambulatory Visit: Payer: Self-pay | Admitting: Family Medicine

## 2017-11-26 NOTE — Telephone Encounter (Signed)
2 refills given. Requested Prescriptions  Pending Prescriptions Disp Refills  . fluticasone (FLONASE) 50 MCG/ACT nasal spray [Pharmacy Med Name: FLUTICASONE PROP 50 MCG SPRAY] 16 g 2    Sig: PLACE 2 SPRAYS INTO BOTH NOSTRILS AT BEDTIME. X 2 WEEKS THEN AS NEEDED TO TREAT NOCTURNAL COUGH     Ear, Nose, and Throat: Nasal Preparations - Corticosteroids Passed - 11/24/2017  1:04 AM      Passed - Valid encounter within last 12 months    Recent Outpatient Visits          2 months ago Annual physical exam   [facility], Levell July, MD   1 year ago Annual physical exam   [facility], Levell July, MD   1 year ago [place]   [facility], Levell July, MD   2 years ago Annual physical exam   [facility], Levell July, MD   3 years ago Health maintenance examination   [facility] at [facility], Levell July, MD

## 2018-06-19 ENCOUNTER — Telehealth: Payer: Self-pay | Admitting: Family Medicine

## 2018-06-19 NOTE — Telephone Encounter (Signed)
Called pt LVM for pt to call back and reschedule appt for Physical  09/03/2018 to different date provider not available FR

## 2018-08-05 IMAGING — DX DG CHEST 2V
2 series · 2 of 2 positions shown · non-contrast
Comparison: 06/26/2012 chest CT

CLINICAL DATA: Cough at night. Pleuroparenchymal scarring on prior
imaging

EXAM:
CHEST - 2 VIEW

[chest pa]
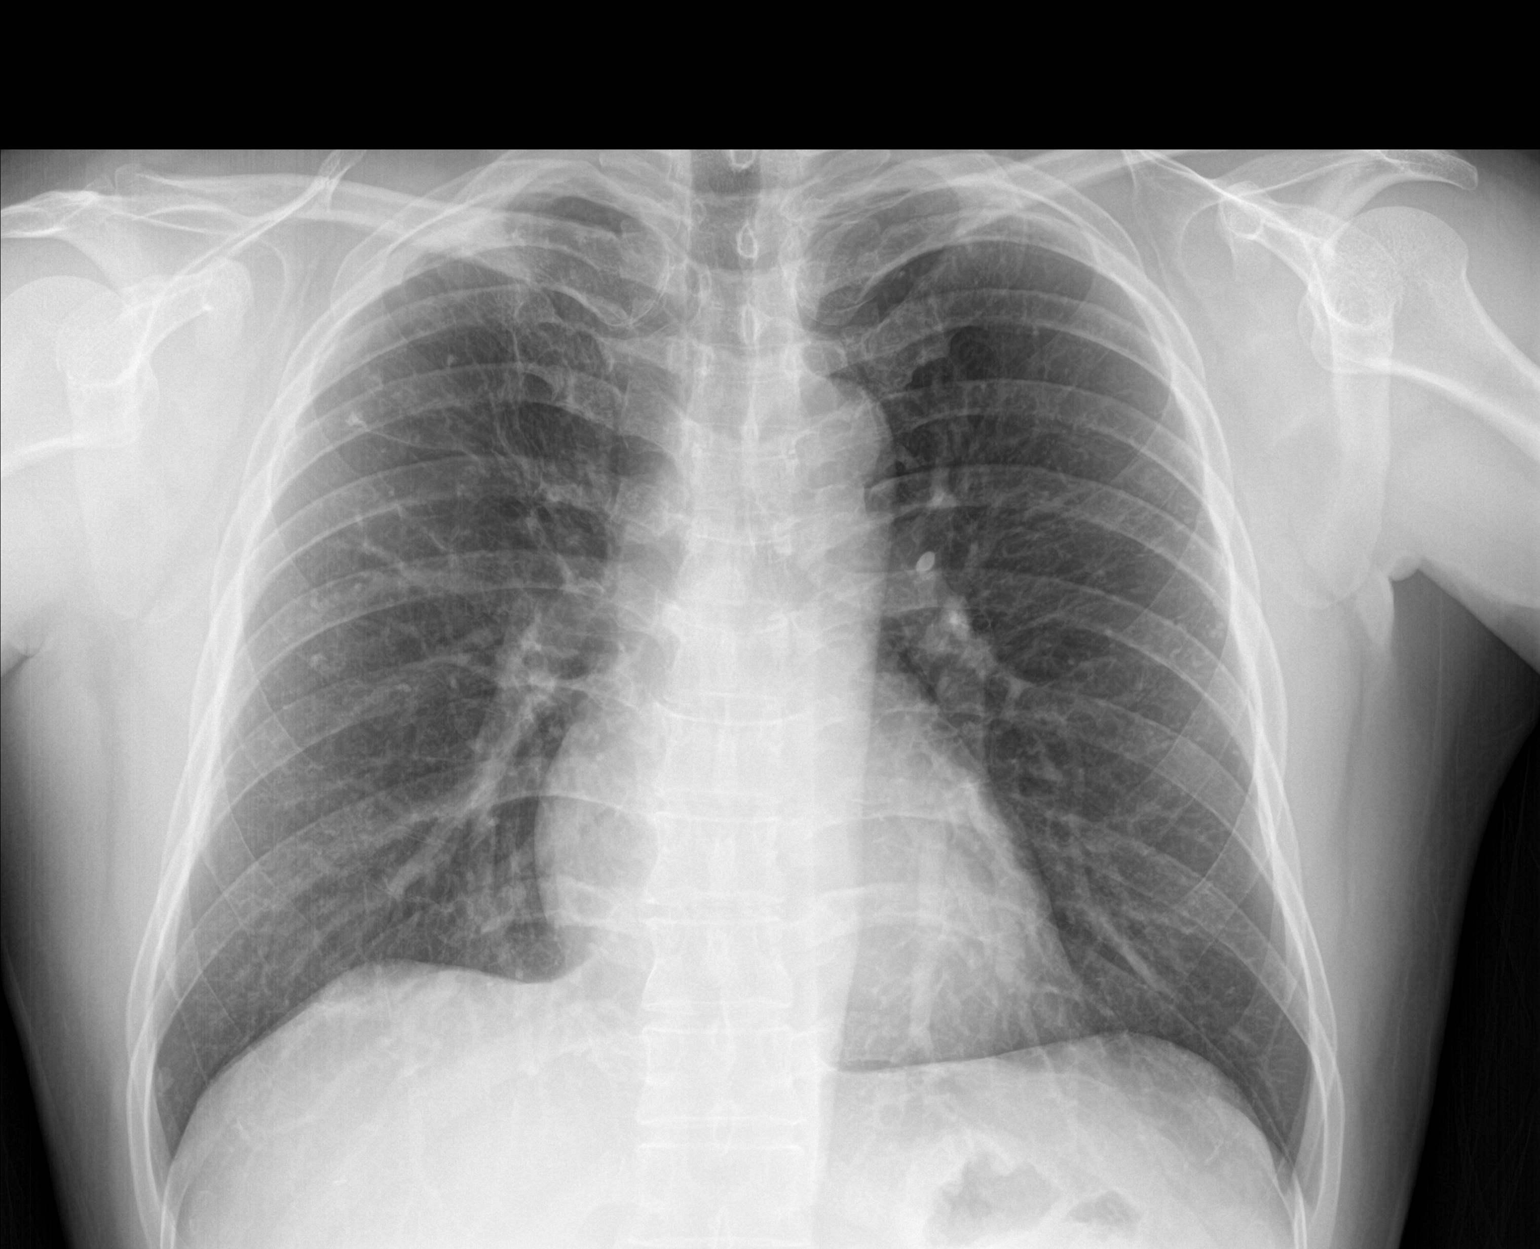

[chest lat]
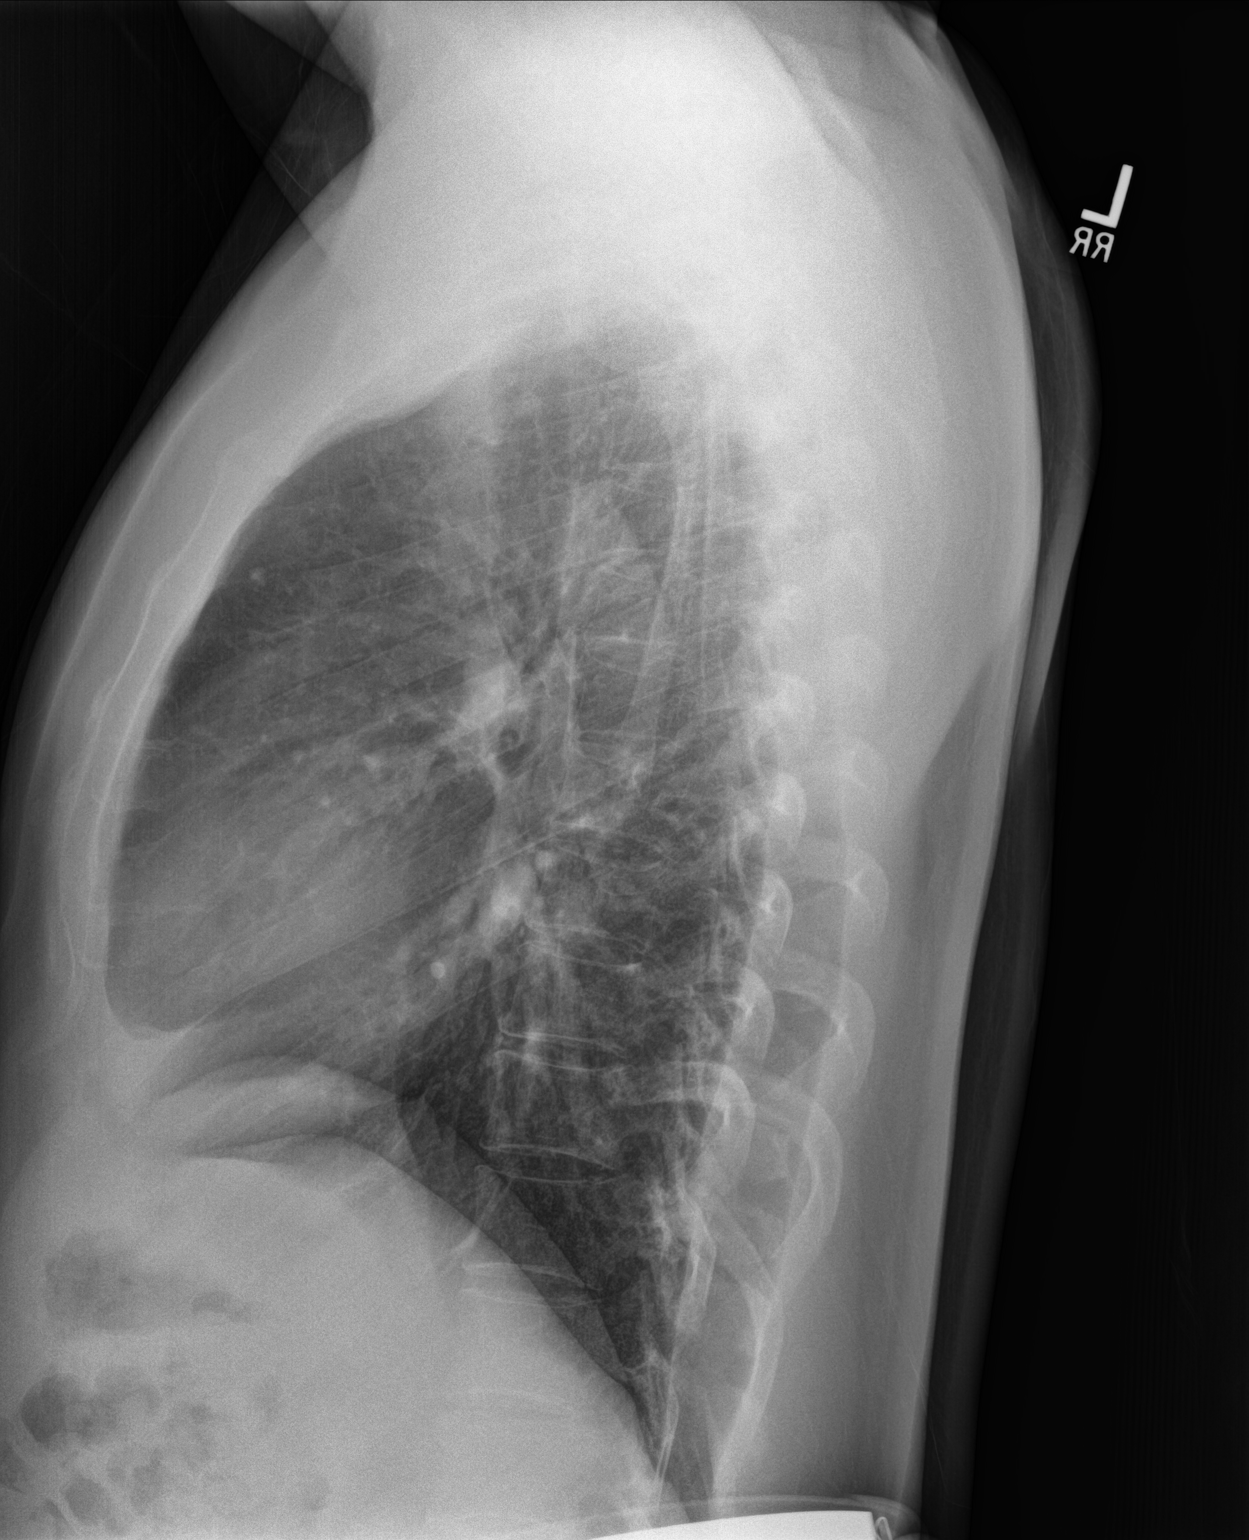

[2 of 2 positions shown; findings below may reference images not displayed]

FINDINGS: Normal heart size and mediastinal contours. Multiple calcified
nodules bilaterally. Fine interstitial opacity at the right apex.
There is no edema, consolidation, effusion, or pneumothorax. No
acute osseous finding.
IMPRESSION: 1. No evidence of acute disease.
2. Old granulomatous disease with lung scarring as seen on 4708
chest CT.

## 2018-09-02 ENCOUNTER — Encounter: Payer: BLUE CROSS/BLUE SHIELD | Admitting: Family Medicine

## 2018-09-03 ENCOUNTER — Encounter: Payer: BLUE CROSS/BLUE SHIELD | Admitting: Family Medicine

## 2018-09-24 ENCOUNTER — Telehealth: Payer: Self-pay | Admitting: Family Medicine

## 2018-09-24 NOTE — Telephone Encounter (Signed)
Pt was having physical and med refills we rescheduled per Proviser can he get a bridge refill to hold him over for appt coming up on 10/04/2018/at 9:20 Pt needs amLODipine (NORVASC) 10 MG tablet  And atenolol (TENORMIN) 25 MG tablet  His Sister thinks please call pt

## 2018-09-26 ENCOUNTER — Other Ambulatory Visit: Payer: Self-pay

## 2018-09-26 ENCOUNTER — Encounter: Payer: BLUE CROSS/BLUE SHIELD | Admitting: Family Medicine

## 2018-09-26 ENCOUNTER — Telehealth: Payer: Self-pay | Admitting: Family Medicine

## 2018-09-26 MED ORDER — AMLODIPINE BESYLATE 10 MG PO TABS: 10.0000 mg | ORAL_TABLET | Freq: Every day | ORAL | 0 refills | Status: DC

## 2018-09-26 MED ORDER — ATENOLOL 25 MG PO TABS: 25.0000 mg | ORAL_TABLET | Freq: Every day | ORAL | 0 refills | Status: DC

## 2018-09-26 MED ORDER — HYDROCHLOROTHIAZIDE 25 MG PO TABS: 25.0000 mg | ORAL_TABLET | Freq: Every day | ORAL | 3 refills | Status: DC

## 2018-09-26 NOTE — Telephone Encounter (Signed)
Patient called back and the correct Pharmacy is [address]   Amlodipine/ hydrochlorathiazide,

## 2018-09-26 NOTE — Telephone Encounter (Signed)
Rxs sent to [facility].

## 2018-10-04 ENCOUNTER — Other Ambulatory Visit: Payer: Self-pay

## 2018-10-04 ENCOUNTER — Encounter: Payer: Self-pay | Admitting: Family Medicine

## 2018-10-04 ENCOUNTER — Ambulatory Visit (INDEPENDENT_AMBULATORY_CARE_PROVIDER_SITE_OTHER): Payer: BC Managed Care – PPO | Admitting: Family Medicine

## 2018-10-04 VITALS — BP 130/89 | HR 68 | Temp 99.0°F | Ht 66.0 in | Wt 181.8 lb

## 2018-10-04 DIAGNOSIS — Z23 Encounter for immunization: Secondary | ICD-10-CM

## 2018-10-04 DIAGNOSIS — Z125 Encounter for screening for malignant neoplasm of prostate: Secondary | ICD-10-CM

## 2018-10-04 DIAGNOSIS — I10 Essential (primary) hypertension: Secondary | ICD-10-CM | POA: Diagnosis not present

## 2018-10-04 DIAGNOSIS — Z0001 Encounter for general adult medical examination with abnormal findings: Secondary | ICD-10-CM

## 2018-10-04 DIAGNOSIS — Z0000 Encounter for general adult medical examination without abnormal findings: Secondary | ICD-10-CM

## 2018-10-04 MED ORDER — ATENOLOL 25 MG PO TABS: 25.0000 mg | ORAL_TABLET | Freq: Every day | ORAL | 3 refills | Status: DC

## 2018-10-04 MED ORDER — HYDROCHLOROTHIAZIDE 25 MG PO TABS: 25.0000 mg | ORAL_TABLET | Freq: Every day | ORAL | 3 refills | Status: DC

## 2018-10-04 MED ORDER — AMLODIPINE BESYLATE 10 MG PO TABS: 10.0000 mg | ORAL_TABLET | Freq: Every day | ORAL | 3 refills | Status: DC

## 2018-10-04 NOTE — Progress Notes (Signed)
8/28/20209:30 AM  Andre Webb 1975-04-03, 43 y.o., male [id number]  Chief Complaint  Patient presents with  . Annual Exam    HPI:   Patient is a 43 y.o. male with past medical history significant for HTN who presents today for CPE  Last CPE July 2019 Colorectal Cancer Screening: at age 65, denies fhx Prostate Cancer Screening: PSA today HIV Screening: 2016 Seasonal Influenza Vaccination: today Td/Tdap Vaccination: 2015 Pneumococcal Vaccination: at age 17 Zoster Vaccination: at age 75 Frequency of Dental evaluation: > 1 year Frequency of Eye evaluation: no, no vision concerns  Fasting today   Hearing Screening   '125Hz'$  '250Hz'$  '500Hz'$  '1000Hz'$  '2000Hz'$  '3000Hz'$  '4000Hz'$  '6000Hz'$  '8000Hz'$   Right ear:           Left ear:             Visual Acuity Screening   Right eye Left eye Both eyes  Without correction: '20/30 20/40 20/30 '$  With correction:        Depression screen [facility] 2/9 10/04/2018 08/30/2017 08/28/2016  Decreased Interest 0 0 0  Down, Depressed, Hopeless 0 0 0  PHQ - 2 Score 0 0 0    Fall Risk  10/04/2018 08/30/2017 08/28/2016 04/13/2016 08/26/2015  Falls in the past year? 0 No No No No  Number falls in past yr: 0 - - - -  Injury with Fall? 0 - - - -     Allergies  Allergen Reactions  . Lisinopril     Cough   . Losartan Cough    Prior to Admission medications   Medication Sig Start Date End Date Taking? Authorizing Provider  amLODipine (NORVASC) 10 MG tablet Take 1 tablet (10 mg total) by mouth daily. 09/26/18  Yes Rutherford Guys, MD  aspirin EC 81 MG tablet Take 1 tablet (81 mg total) by mouth daily. 12/04/12  Yes Shawnee Knapp, MD  atenolol (TENORMIN) 25 MG tablet Take 1 tablet (25 mg total) by mouth daily. 09/26/18  Yes Rutherford Guys, MD  hydrochlorothiazide (HYDRODIURIL) 25 MG tablet Take 1 tablet (25 mg total) by mouth daily. 09/26/18  Yes Rutherford Guys, MD    Past Medical History:  Diagnosis Date  . Hypertension     History reviewed. No pertinent surgical  history.  Social History   Tobacco Use  . Smoking status: Never Smoker  . Smokeless tobacco: Never Used  Substance Use Topics  . Alcohol use: No    Alcohol/week: 0.0 standard drinks    Family History  Problem Relation Age of Onset  . Diabetes Father     Review of Systems  Constitutional: Negative for chills and fever.  Respiratory: Negative for cough and shortness of breath.   Cardiovascular: Negative for chest pain, palpitations and leg swelling.  Gastrointestinal: Negative for abdominal pain, nausea and vomiting.  All other systems reviewed and are negative.    OBJECTIVE:  Today's Vitals   10/04/18 0926  BP: 130/89  Pulse: 68  Temp: 99 F (37.2 C)  TempSrc: Oral  SpO2: 97%  Weight: 181 lb 12.8 oz (82.5 kg)  Height: '5\' 6"'$  (1.676 m)   Body mass index is 29.34 kg/m.  Wt Readings from Last 3 Encounters:  10/04/18 181 lb 12.8 oz (82.5 kg)  08/30/17 175 lb 12.8 oz (79.7 kg)  08/28/16 175 lb 6.4 oz (79.6 kg)   BP Readings from Last 3 Encounters:  10/04/18 130/89  08/30/17 110/72  08/28/16 (!) 136/91    Physical Exam Vitals signs  and nursing note reviewed.  Constitutional:      Appearance: He is well-developed.  HENT:     Head: Normocephalic and atraumatic.     Right Ear: Hearing, tympanic membrane, ear canal and external ear normal.     Left Ear: Hearing, tympanic membrane, ear canal and external ear normal.     Mouth/Throat:     Pharynx: No oropharyngeal exudate.  Eyes:     Conjunctiva/sclera: Conjunctivae normal.     Pupils: Pupils are equal, round, and reactive to light.  Neck:     Musculoskeletal: Neck supple.     Thyroid: No thyromegaly.  Cardiovascular:     Rate and Rhythm: Normal rate and regular rhythm.     Heart sounds: Normal heart sounds. No murmur. No friction rub. No gallop.   Pulmonary:     Effort: Pulmonary effort is normal.     Breath sounds: Normal breath sounds. No wheezing, rhonchi or rales.  Abdominal:     General: Bowel  sounds are normal. There is no distension.     Palpations: Abdomen is soft. There is no mass.     Tenderness: There is no abdominal tenderness.  Musculoskeletal: Normal range of motion.     Right lower leg: No edema.     Left lower leg: No edema.  Lymphadenopathy:     Cervical: No cervical adenopathy.  Skin:    General: Skin is warm and dry.  Neurological:     Mental Status: He is alert and oriented to person, place, and time.     Cranial Nerves: No cranial nerve deficit.     Coordination: Coordination normal.     Gait: Gait normal.     Deep Tendon Reflexes: Reflexes are normal and symmetric.  Psychiatric:        Mood and Affect: Mood normal.        Behavior: Behavior normal.     No results found for this or any previous visit (from the past 24 hour(s)).  No results found.   ASSESSMENT and PLAN  1. Annual physical exam Routine HCM labs ordered. HCM reviewed/discussed. Anticipatory guidance regarding healthy weight, lifestyle and choices given.   2. Need for vaccination - Flu Vaccine QUAD 36+ mos IM  3. Essential hypertension Controlled. Continue current regime.  - Lipid panel - CMP14+EGFR  4. Screening for prostate cancer - PSA  Other orders - amLODipine (NORVASC) 10 MG tablet; Take 1 tablet (10 mg total) by mouth daily. - atenolol (TENORMIN) 25 MG tablet; Take 1 tablet (25 mg total) by mouth daily. - hydrochlorothiazide (HYDRODIURIL) 25 MG tablet; Take 1 tablet (25 mg total) by mouth daily.  No follow-ups on file.    Rutherford Guys, MD Primary Care at [facility] [facility], [place] [address] Ph.  [phone] Fax [phone]

## 2018-10-04 NOTE — Patient Instructions (Addendum)
Preventive Care 45-45 Years Old, Male Preventive care refers to lifestyle choices and visits with your health care provider that can promote health and wellness. This includes:  A yearly physical exam. This is also called an annual well check.  Regular dental and eye exams.  Immunizations.  Screening for certain conditions.  Healthy lifestyle choices, such as eating a healthy diet, getting regular exercise, not using drugs or products that contain nicotine and tobacco, and limiting alcohol use. What can I expect for my preventive care visit? Physical exam Your health care provider will check:  Height and weight. These may be used to calculate body mass index (BMI), which is a measurement that tells if you are at a healthy weight.  Heart rate and blood pressure.  Your skin for abnormal spots. Counseling Your health care provider may ask you questions about:  Alcohol, tobacco, and drug use.  Emotional well-being.  Home and relationship well-being.  Sexual activity.  Eating habits.  Work and work Statistician. What immunizations do I need?  Influenza (flu) vaccine  This is recommended every year. Tetanus, diphtheria, and pertussis (Tdap) vaccine  You may need a Td booster every 10 years. Varicella (chickenpox) vaccine  You may need this vaccine if you have not already been vaccinated. Zoster (shingles) vaccine  You may need this after age 45. Measles, mumps, and rubella (MMR) vaccine  You may need at least one dose of MMR if you were born in 1957 or later. You may also need a second dose. Pneumococcal conjugate (PCV13) vaccine  You may need this if you have certain conditions and were not previously vaccinated. Pneumococcal polysaccharide (PPSV23) vaccine  You may need one or two doses if you smoke cigarettes or if you have certain conditions. Meningococcal conjugate (MenACWY) vaccine  You may need this if you have certain conditions. Hepatitis A  vaccine  You may need this if you have certain conditions or if you travel or work in places where you may be exposed to hepatitis A. Hepatitis B vaccine  You may need this if you have certain conditions or if you travel or work in places where you may be exposed to hepatitis B. Haemophilus influenzae type b (Hib) vaccine  You may need this if you have certain risk factors. Human papillomavirus (HPV) vaccine  If recommended by your health care provider, you may need three doses over 6 months. You may receive vaccines as individual doses or as more than one vaccine together in one shot (combination vaccines). Talk with your health care provider about the risks and benefits of combination vaccines. What tests do I need? Blood tests  Lipid and cholesterol levels. These may be checked every 5 years, or more frequently if you are over 60 years old.  Hepatitis C test.  Hepatitis B test. Screening  Lung cancer screening. You may have this screening every year starting at age 45 if you have a 30-pack-year history of smoking and currently smoke or have quit within the past 15 years.  Prostate cancer screening. Recommendations will vary depending on your family history and other risks.  Colorectal cancer screening. All adults should have this screening starting at age 45 and continuing until age 2. Your health care provider may recommend screening at age 45 if you are at increased risk. You will have tests every 1-10 years, depending on your results and the type of screening test.  Diabetes screening. This is done by checking your blood sugar (glucose) after you have not eaten  for a while (fasting). You may have this done every 1-3 years.  Sexually transmitted disease (STD) testing. Follow these instructions at home: Eating and drinking  Eat a diet that includes fresh fruits and vegetables, whole grains, lean protein, and low-fat dairy products.  Take vitamin and mineral supplements as  recommended by your health care provider.  Do not drink alcohol if your health care provider tells you not to drink.  If you drink alcohol: ? Limit how much you have to 0-2 drinks a day. ? Be aware of how much alcohol is in your drink. In the [place] one drink equals one 12 oz bottle of beer (355 mL), one 5 oz glass of wine (148 mL), or one 1 oz glass of hard liquor (44 mL). Lifestyle  Take daily care of your teeth and gums.  Stay active. Exercise for at least 30 minutes on 5 or more days each week.  Do not use any products that contain nicotine or tobacco, such as cigarettes, e-cigarettes, and chewing tobacco. If you need help quitting, ask your health care provider.  If you are sexually active, practice safe sex. Use a condom or other form of protection to prevent STIs (sexually transmitted infections).  Talk with your health care provider about taking a low-dose aspirin every day starting at age 45. What's next?  Go to your health care provider once a year for a well check visit.  Ask your health care provider how often you should have your eyes and teeth checked.  Stay up to date on all vaccines. This information is not intended to replace advice given to you by your health care provider. Make sure you discuss any questions you have with your health care provider. Document Released: 02/19/2015 Document Revised: 01/17/2018 Document Reviewed: 01/17/2018 Elsevier Patient Education  [employer].     If you have lab work done today you will be contacted with your lab results within the next 2 weeks.  If you have not heard from [place] then please contact us. The fastest way to get your results is to register for My Chart.   IF you received an x-ray today, you will receive an invoice from [facility]. Please contact [facility] at [phone] with questions or concerns regarding your invoice.   IF you received labwork today, you will receive an invoice from  [facility]. Please contact [facility] at [phone] with questions or concerns regarding your invoice.   Our billing staff will not be able to assist you with questions regarding bills from these companies.  You will be contacted with the lab results as soon as they are available. The fastest way to get your results is to activate your My Chart account. Instructions are located on the last page of this paperwork. If you have not heard from [place] regarding the results in 2 weeks, please contact this office.

## 2018-10-05 LAB — CMP14+EGFR
ALT: 44 IU/L (ref 0–44)
AST: 28 IU/L (ref 0–40)
Albumin/Globulin Ratio: 1.8 (ref 1.2–2.2)
Albumin: 4.6 g/dL (ref 4.0–5.0)
Alkaline Phosphatase: 49 IU/L (ref 39–117)
BUN/Creatinine Ratio: 17 (ref 9–20)
BUN: 13 mg/dL (ref 6–24)
Bilirubin Total: 0.6 mg/dL (ref 0.0–1.2)
CO2: 23 mmol/L (ref 20–29)
Calcium: 9.3 mg/dL (ref 8.7–10.2)
Chloride: 100 mmol/L (ref 96–106)
Creatinine, Ser: 0.75 mg/dL — ABNORMAL LOW (ref 0.76–1.27)
GFR calc Af Amer: 129 mL/min/{1.73_m2} (ref >59)
GFR calc non Af Amer: 112 mL/min/{1.73_m2} (ref >59)
Globulin, Total: 2.6 g/dL (ref 1.5–4.5)
Glucose: 94 mg/dL (ref 65–99)
Potassium: 3.5 mmol/L (ref 3.5–5.2)
Sodium: 140 mmol/L (ref 134–144)
Total Protein: 7.2 g/dL (ref 6.0–8.5)

## 2018-10-05 LAB — LIPID PANEL
Chol/HDL Ratio: 4.4 ratio (ref 0.0–5.0)
Cholesterol, Total: 157 mg/dL (ref 100–199)
HDL: 36 mg/dL — ABNORMAL LOW (ref >39)
LDL Calculated: 98 mg/dL (ref 0–99)
Triglycerides: 114 mg/dL (ref 0–149)
VLDL Cholesterol Cal: 23 mg/dL (ref 5–40)

## 2018-10-05 LAB — PSA: Prostate Specific Ag, Serum: 1.2 ng/mL (ref 0.0–4.0)

## 2018-10-21 ENCOUNTER — Encounter: Payer: Self-pay | Admitting: Radiology

## 2018-10-23 ENCOUNTER — Other Ambulatory Visit: Payer: Self-pay | Admitting: Family Medicine

## 2018-12-31 DIAGNOSIS — Z13220 Encounter for screening for lipoid disorders: Secondary | ICD-10-CM | POA: Diagnosis not present

## 2018-12-31 DIAGNOSIS — R030 Elevated blood-pressure reading, without diagnosis of hypertension: Secondary | ICD-10-CM | POA: Diagnosis not present

## 2018-12-31 DIAGNOSIS — Z01411 Encounter for gynecological examination (general) (routine) with abnormal findings: Secondary | ICD-10-CM | POA: Diagnosis not present

## 2018-12-31 DIAGNOSIS — Z13228 Encounter for screening for other metabolic disorders: Secondary | ICD-10-CM | POA: Diagnosis not present

## 2018-12-31 DIAGNOSIS — Z23 Encounter for immunization: Secondary | ICD-10-CM | POA: Diagnosis not present

## 2019-10-08 ENCOUNTER — Encounter: Payer: BC Managed Care – PPO | Admitting: Family Medicine

## 2019-10-11 ENCOUNTER — Other Ambulatory Visit: Payer: Self-pay | Admitting: Family Medicine

## 2019-10-11 NOTE — Telephone Encounter (Signed)
Requested Prescriptions  Pending Prescriptions Disp Refills  . amLODipine (NORVASC) 10 MG tablet [Pharmacy Med Name: AMLODIPINE BESYLATE 10 MG TAB] 7 tablet 0    Sig: TAKE 1 TABLET BY MOUTH EVERY DAY     Cardiovascular:  Calcium Channel Blockers Failed - 10/11/2019  9:05 AM      Failed - Valid encounter within last 6 months    Recent Outpatient Visits          1 year ago Annual physical exam   [facility], Meda Coffee, MD   2 years ago Annual physical exam   [facility], Levell July, MD   3 years ago Annual physical exam   [facility], Levell July, MD   3 years ago [place]   [facility], Levell July, MD   4 years ago Annual physical exam   [facility], Levell July, MD      Future Appointments            In 6 days Alwyn Ren Sandria Bales, MD [facility] at [place], [facility]   In 2 months Shade Flood, MD [facility] at [facility], [facility]           Passed - Last BP in normal range    BP Readings from Last 1 Encounters:  10/04/18 130/89

## 2019-10-17 ENCOUNTER — Ambulatory Visit (INDEPENDENT_AMBULATORY_CARE_PROVIDER_SITE_OTHER): Payer: BC Managed Care – PPO | Admitting: Family Medicine

## 2019-10-17 ENCOUNTER — Encounter: Payer: Self-pay | Admitting: Family Medicine

## 2019-10-17 ENCOUNTER — Other Ambulatory Visit: Payer: Self-pay

## 2019-10-17 VITALS — BP 114/70 | HR 64 | Temp 98.5°F | Ht 66.0 in | Wt 183.5 lb

## 2019-10-17 DIAGNOSIS — Z0000 Encounter for general adult medical examination without abnormal findings: Secondary | ICD-10-CM | POA: Diagnosis not present

## 2019-10-17 DIAGNOSIS — Z23 Encounter for immunization: Secondary | ICD-10-CM | POA: Diagnosis not present

## 2019-10-17 DIAGNOSIS — Z0001 Encounter for general adult medical examination with abnormal findings: Secondary | ICD-10-CM | POA: Diagnosis not present

## 2019-10-17 DIAGNOSIS — I10 Essential (primary) hypertension: Secondary | ICD-10-CM | POA: Diagnosis not present

## 2019-10-17 MED ORDER — ATENOLOL 25 MG PO TABS: 25.0000 mg | ORAL_TABLET | Freq: Every day | ORAL | 3 refills | Status: AC

## 2019-10-17 MED ORDER — HYDROCHLOROTHIAZIDE 25 MG PO TABS: 25.0000 mg | ORAL_TABLET | Freq: Every day | ORAL | 3 refills | Status: DC

## 2019-10-17 MED ORDER — AMLODIPINE BESYLATE 10 MG PO TABS: 10.0000 mg | ORAL_TABLET | Freq: Every day | ORAL | 3 refills | Status: AC

## 2019-10-17 NOTE — Progress Notes (Signed)
Patient ID: Andre Webb, male    DOB: 09-25-1975  Age: 44 y.o. MRN: [id number]  Chief Complaint  Patient presents with   Annual Exam    CPE    Subjective:  Patient is here for his annual physical examination.  He has generally been quite healthy except for the history of high blood pressure.  He has no major acute complaints today.  Past medical history: Operations: None except for a little cyst removed from his hand. Hospitalizations: None Major illnesses: None Allergies: Lisinopril and losartan cough Medications: Amlodipine, atenolol, and hydrochlorothiazide is on the chart Immunizations: Has had both Pfizer shots for Covid.  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  Does a lot of heavy lifting.  Is not doing a lot of regular exercise at this time.  Back before Covid he used to get more active exercise.  He is married and has 1 child who is 62 years old.  He has parents.  He is from [place], [place], came in the 1990s.  Family history: Parents are living.  Father is 101 mother 75.  Father has diabetes.  His siblings are healthy.  One sibling actually has diabetes also.  Review of systems: Constitutional: Unremarkable HEENT: Unremarkable.  Does not wear glasses. Respiratory: Unremarkable Cardiovascular: Unremarkable GI: Unremarkable GU: Unremarkable Endocrine: Unremarkable Musculoskeletal: Unremarkable Neurologic: Unremarkable Psychiatric: Unremarkable Dermatologic: Unremarkable   Current allergies, medications, problem list, past/family and social histories reviewed.  Objective:  BP 114/70    Pulse 64    Temp 98.5 F (36.9 C) (Temporal)    Ht $R'5\' 6"'AB$  (1.676 m)    Wt 183 lb 8 oz (83.2 kg)    SpO2 96%    BMI 29.62 kg/m   Well-developed well-nourished male in no acute distress.  Speaks good English.  TMs normal.  Eyes PERRL.  Fundi benign.  Throat clear.  Teeth good.  Neck supple without nodes or thyromegaly.  No carotid bruits.  Chest is clear to  auscultation.  Heart rate without murmurs.  Abdomen soft without mass or tenderness.  Normal male external genitalia with testes descended.  Uncircumcised.  Digital rectal exam normal.  Skin unremarkable.  No skeletal normal.  Neurologic grossly normal with normal gait.  Does have a scar in the right palm from his surgery.  Assessment & Plan:   Assessment: 1. Annual physical exam   2. Essential hypertension       Plan: See instructions.  Orders Placed This Encounter  Procedures   Flu Vaccine QUAD 36+ mos IM   CMP14+EGFR   Lipid panel   Hemoglobin A1c   PSA    Meds ordered this encounter  Medications   amLODipine (NORVASC) 10 MG tablet    Sig: Take 1 tablet (10 mg total) by mouth daily.    Dispense:  90 tablet    Refill:  3    Pt given enough refill to last until upcoming appt   atenolol (TENORMIN) 25 MG tablet    Sig: Take 1 tablet (25 mg total) by mouth daily.    Dispense:  90 tablet    Refill:  3   hydrochlorothiazide (HYDRODIURIL) 25 MG tablet    Sig: Take 1 tablet (25 mg total) by mouth daily.    Dispense:  90 tablet    Refill:  3         Patient Instructions    Continue your same blood pressure medications.  As we discussed, I would like to encourage  you to get about 30 minutes of active exercise at least 5 days a week.  Return at anytime if problems arise.  Continue to seek to be physically, emotionally, relationally, and spiritually healthy as discussed.   If you have lab work done today you will be contacted with your lab results within the next 2 weeks.  If you have not heard from [place] then please contact us. The fastest way to get your results is to register for My Chart.   IF you received an x-ray today, you will receive an invoice from [facility]. Please contact [facility] at [phone] with questions or concerns regarding your invoice.   IF you received labwork today, you will receive an invoice from [facility].  Please contact [facility] at [phone] with questions or concerns regarding your invoice.   Our billing staff will not be able to assist you with questions regarding bills from these companies.  You will be contacted with the lab results as soon as they are available. The fastest way to get your results is to activate your My Chart account. Instructions are located on the last page of this paperwork. If you have not heard from [place] regarding the results in 2 weeks, please contact this office.         Return in about 1 year (around 10/16/2020).   Ruben Reason, MD 10/17/2019

## 2019-10-17 NOTE — Patient Instructions (Addendum)
  Continue your same blood pressure medications.  As we discussed, I would like to encourage you to get about 30 minutes of active exercise at least 5 days a week.  Return at anytime if problems arise.  Continue to seek to be physically, emotionally, relationally, and spiritually healthy as discussed.   If you have lab work done today you will be contacted with your lab results within the next 2 weeks.  If you have not heard from [place] then please contact us. The fastest way to get your results is to register for My Chart.   IF you received an x-ray today, you will receive an invoice from [facility]. Please contact [facility] at [phone] with questions or concerns regarding your invoice.   IF you received labwork today, you will receive an invoice from [facility]. Please contact [facility] at [phone] with questions or concerns regarding your invoice.   Our billing staff will not be able to assist you with questions regarding bills from these companies.  You will be contacted with the lab results as soon as they are available. The fastest way to get your results is to activate your My Chart account. Instructions are located on the last page of this paperwork. If you have not heard from [place] regarding the results in 2 weeks, please contact this office.

## 2019-10-18 LAB — CMP14+EGFR
ALT: 42 IU/L (ref 0–44)
AST: 27 IU/L (ref 0–40)
Albumin/Globulin Ratio: 1.6 (ref 1.2–2.2)
Albumin: 4.7 g/dL (ref 4.0–5.0)
Alkaline Phosphatase: 61 IU/L (ref 48–121)
BUN/Creatinine Ratio: 15 (ref 9–20)
BUN: 12 mg/dL (ref 6–24)
Bilirubin Total: 0.8 mg/dL (ref 0.0–1.2)
CO2: 27 mmol/L (ref 20–29)
Calcium: 9.8 mg/dL (ref 8.7–10.2)
Chloride: 96 mmol/L (ref 96–106)
Creatinine, Ser: 0.8 mg/dL (ref 0.76–1.27)
GFR calc Af Amer: 125 mL/min/{1.73_m2} (ref >59)
GFR calc non Af Amer: 108 mL/min/{1.73_m2} (ref >59)
Globulin, Total: 2.9 g/dL (ref 1.5–4.5)
Glucose: 92 mg/dL (ref 65–99)
Potassium: 3.3 mmol/L — ABNORMAL LOW (ref 3.5–5.2)
Sodium: 140 mmol/L (ref 134–144)
Total Protein: 7.6 g/dL (ref 6.0–8.5)

## 2019-10-18 LAB — LIPID PANEL
Chol/HDL Ratio: 4.8 ratio (ref 0.0–5.0)
Cholesterol, Total: 168 mg/dL (ref 100–199)
HDL: 35 mg/dL — ABNORMAL LOW (ref >39)
LDL Chol Calc (NIH): 112 mg/dL — ABNORMAL HIGH (ref 0–99)
Triglycerides: 112 mg/dL (ref 0–149)
VLDL Cholesterol Cal: 21 mg/dL (ref 5–40)

## 2019-10-18 LAB — HEMOGLOBIN A1C
Est. average glucose Bld gHb Est-mCnc: 126 mg/dL
Hgb A1c MFr Bld: 6 % — ABNORMAL HIGH (ref 4.8–5.6)

## 2019-10-18 LAB — PSA: Prostate Specific Ag, Serum: 1.3 ng/mL (ref 0.0–4.0)

## 2019-10-24 NOTE — Progress Notes (Signed)
Call: Labs are good except you have an elevated Hgb A1C which indicates you are pre diabetic.  I urge you to get more exercise and eat less to lose weight and avoid becoming a diabetic.    Also, your potassium is a little low due to the blood pressure medicine hydrochlorothiazide.  Decrease it to 1/2 pill daily.    Return in 3 mo to see doctor and recheck labs.  Peyton Najjar MD

## 2019-12-26 ENCOUNTER — Other Ambulatory Visit: Payer: Self-pay

## 2019-12-26 ENCOUNTER — Encounter: Payer: Self-pay | Admitting: Family Medicine

## 2019-12-26 ENCOUNTER — Ambulatory Visit (INDEPENDENT_AMBULATORY_CARE_PROVIDER_SITE_OTHER): Payer: BC Managed Care – PPO | Admitting: Family Medicine

## 2019-12-26 VITALS — BP 133/83 | HR 78 | Temp 98.7°F | Ht 66.0 in | Wt 184.0 lb

## 2019-12-26 DIAGNOSIS — R7303 Prediabetes: Secondary | ICD-10-CM | POA: Diagnosis not present

## 2019-12-26 DIAGNOSIS — I10 Essential (primary) hypertension: Secondary | ICD-10-CM | POA: Diagnosis not present

## 2019-12-26 DIAGNOSIS — D751 Secondary polycythemia: Secondary | ICD-10-CM | POA: Diagnosis not present

## 2019-12-26 DIAGNOSIS — E876 Hypokalemia: Secondary | ICD-10-CM

## 2019-12-26 MED ORDER — HYDROCHLOROTHIAZIDE 12.5 MG PO TABS: 12.5000 mg | ORAL_TABLET | Freq: Every day | ORAL | 2 refills | Status: AC

## 2019-12-26 NOTE — Progress Notes (Signed)
Subjective:  Patient ID: Andre Webb, male    DOB: Jan 27, 1976  Age: 44 y.o. MRN: [id number]  CC:  Chief Complaint  Patient presents with  . Establish Care    Pt reports he feels well with no complaints.    HPI Andre Webb presents for  New patient to establish care, prior Dr. Leretha Pol.   Most recently seen in September by Dr. Alwyn Ren for physical.  Elevated A1c and slight hypokalemia noted at that time.  History of secondary polycythemia and hypertension.  Prediabetes Lab Results  Component Value Date   HGBA1C 6.0 (H) 10/17/2019  dad has diabetes. Glucose 92 on 10/17/19.   Hypertension: Amlodipine 10 mg daily, atenolol 25 mg daily, hydrochlorothiazide 25 mg daily, recommended half dosing after recent borderline low potassium. Now taking 1/2 dose of hctz.   Lab Results  Component Value Date   K 3.3 (L) 10/17/2019   Home readings: 130/82.  BP Readings from Last 3 Encounters:  12/26/19 133/83  10/17/19 114/70  10/04/18 130/89   Lab Results  Component Value Date   CREATININE 0.80 10/17/2019   Polycythemia Most recent CBC normal in 2019. Lab Results  Component Value Date   WBC 5.8 08/30/2017   HGB 17.3 08/30/2017   HCT 51.4 08/30/2017   MCV 88.8 08/30/2017   PLT 307 08/28/2016       History Patient Active Problem List   Diagnosis Date Noted  . Polycythemia, secondary 08/30/2017  . HTN (hypertension) 12/22/2012   Past Medical History:  Diagnosis Date  . Hypertension    No past surgical history on file. Allergies  Allergen Reactions  . Lisinopril     Cough   . Losartan Cough   Prior to Admission medications   Medication Sig Start Date End Date Taking? Authorizing Provider  amLODipine (NORVASC) 10 MG tablet Take 1 tablet (10 mg total) by mouth daily. 10/17/19  Yes Peyton Najjar, MD  aspirin EC 81 MG tablet Take 1 tablet (81 mg total) by mouth daily. 12/04/12  Yes Sherren Mocha, MD  atenolol (TENORMIN) 25 MG tablet Take 1 tablet (25 mg total) by  mouth daily. 10/17/19  Yes Peyton Najjar, MD  hydrochlorothiazide (HYDRODIURIL) 25 MG tablet Take 1 tablet (25 mg total) by mouth daily. 10/17/19  Yes Peyton Najjar, MD   Social History   Socioeconomic History  . Marital status: Married    Spouse name: Not on file  . Number of children: Not on file  . Years of education: 83  . Highest education level: High school graduate  Occupational History  . Occupation: Nutritional therapist: [employer]  Tobacco Use  . Smoking status: Never Smoker  . Smokeless tobacco: Never Used  Vaping Use  . Vaping Use: Never used  Substance and Sexual Activity  . Alcohol use: No    Alcohol/week: 0.0 standard drinks  . Drug use: No  . Sexual activity: Yes    Comment: monogamous relationship with wife  Other Topics Concern  . Not on file  Social History Narrative   Married. Education: [employer].   Social Determinants of Health   Financial Resource Strain:   . Difficulty of Paying Living Expenses: Not on file  Food Insecurity:   . Worried About Programme researcher, broadcasting/film/video in the Last Year: Not on file  . Ran Out of Food in the Last Year: Not on file  Transportation Needs:   . Lack of Transportation (Medical): Not on  file  . Lack of Transportation (Non-Medical): Not on file  Physical Activity:   . Days of Exercise per Week: Not on file  . Minutes of Exercise per Session: Not on file  Stress:   . Feeling of Stress : Not on file  Social Connections:   . Frequency of Communication with Friends and Family: Not on file  . Frequency of Social Gatherings with Friends and Family: Not on file  . Attends Religious Services: Not on file  . Active Member of Clubs or Organizations: Not on file  . Attends Banker Meetings: Not on file  . Marital Status: Not on file  Intimate Partner Violence:   . Fear of Current or Ex-Partner: Not on file  . Emotionally Abused: Not on file  . Physically Abused: Not on file  . Sexually Abused: Not on file     Review of Systems  Constitutional: Negative for fatigue and unexpected weight change.  Eyes: Negative for visual disturbance.  Respiratory: Negative for cough, chest tightness and shortness of breath.   Cardiovascular: Negative for chest pain, palpitations and leg swelling.  Gastrointestinal: Negative for abdominal pain and blood in stool.  Neurological: Negative for dizziness, light-headedness and headaches.     Objective:   Vitals:   12/26/19 1616  BP: 133/83  Pulse: 78  Temp: 98.7 F (37.1 C)  TempSrc: Temporal  SpO2: 97%  Weight: 184 lb (83.5 kg)  Height: 5\' 6"  (1.676 m)     Physical Exam Vitals reviewed.  Constitutional:      Appearance: He is well-developed.  HENT:     Head: Normocephalic and atraumatic.  Eyes:     Pupils: Pupils are equal, round, and reactive to light.  Neck:     Vascular: No carotid bruit or JVD.  Cardiovascular:     Rate and Rhythm: Normal rate and regular rhythm.     Heart sounds: Normal heart sounds. No murmur heard.   Pulmonary:     Effort: Pulmonary effort is normal.     Breath sounds: Normal breath sounds. No rales.  Skin:    General: Skin is warm and dry.  Neurological:     Mental Status: He is alert and oriented to person, place, and time.  Psychiatric:        Mood and Affect: Mood normal.        Behavior: Behavior normal.     Assessment & Plan:  Andre Webb is a 44 y.o. male . Essential hypertension - Plan: hydrochlorothiazide (HYDRODIURIL) 12.5 MG tablet  - tolerating lower dose hctz. Reordered at 12.5mg .  continue other meds.  Polycythemia, secondary - Plan: CBC  Hypokalemia - Plan: Basic metabolic panel  Prediabetes - Plan: Hemoglobin A1c Handout given.   Recheck 6 months.  Meds ordered this encounter  Medications  . hydrochlorothiazide (HYDRODIURIL) 12.5 MG tablet    Sig: Take 1 tablet (12.5 mg total) by mouth daily.    Dispense:  90 tablet    Refill:  2   Patient Instructions   Continue same dose  of atenolol and amlodipine.  Hydrochlorothiazide should remain at 12.5 mg/day.  You can switch over from the half pill of your current medicine to 1 full pill of the 12.5 mg dose whenever you are ready and that was sent to your pharmacy    Prediabetes Prediabetes is the condition of having a blood sugar (blood glucose) level that is higher than it should be, but not high enough for you to be diagnosed  with type 2 diabetes. Having prediabetes puts you at risk for developing type 2 diabetes (type 2 diabetes mellitus). Prediabetes may be called impaired glucose tolerance or impaired fasting glucose. Prediabetes usually does not cause symptoms. Your health care provider can diagnose this condition with blood tests. You may be tested for prediabetes if you are overweight and if you have at least one other risk factor for prediabetes. What is blood glucose, and how is it measured? Blood glucose refers to the amount of glucose in your bloodstream. Glucose comes from eating foods that contain sugars and starches (carbohydrates), which the body breaks down into glucose. Your blood glucose level may be measured in mg/dL (milligrams per deciliter) or mmol/L (millimoles per liter). Your blood glucose may be checked with one or more of the following blood tests:  A fasting blood glucose (FBG) test. You will not be allowed to eat (you will fast) for 8 hours or longer before a blood sample is taken. ? A normal range for FBG is 70-100 mg/dl (1.6-1.03.9-5.6 mmol/L).  An A1c (hemoglobin A1c) blood test. This test provides information about blood glucose control over the previous 2?3months.  An oral glucose tolerance test (OGTT). This test measures your blood glucose at two times: ? After fasting. This is your baseline level. ? Two hours after you drink a beverage that contains glucose. You may be diagnosed with prediabetes:  If your FBG is 100?125 mg/dL (9.6-0.45.6-6.9 mmol/L).  If your A1c level is 5.7?6.4%.  If your OGTT  result is 140?199 mg/dL (5.4-097.8-11 mmol/L). These blood tests may be repeated to confirm your diagnosis. How can this condition affect me? The pancreas produces a hormone (insulin) that helps to move glucose from the bloodstream into cells. When cells in the body do not respond properly to insulin that the body makes (insulin resistance), excess glucose builds up in the blood instead of going into cells. As a result, high blood glucose (hyperglycemia) can develop, which can cause many complications. Hyperglycemia is a symptom of prediabetes. Having high blood glucose for a long time is dangerous. Too much glucose in your blood can damage your nerves and blood vessels. Long-term damage can lead to complications from diabetes, which may include:  Heart disease.  Stroke.  Blindness.  Kidney disease.  Depression.  Poor circulation in the feet and legs, which could lead to surgical removal (amputation) in severe cases. What can increase my risk? Risk factors for prediabetes include:  Having a family member with type 2 diabetes.  Being overweight or obese.  Being older than age 46.  Being of American [place], African-American, Hispanic/Latino, or Asian/Pacific Islander descent.  Having an inactive (sedentary) lifestyle.  Having a history of heart disease.  History of gestational diabetes or polycystic ovary syndrome (PCOS), in women.  Having low levels of good cholesterol (HDL-C) or high levels of blood fats (triglycerides).  Having high blood pressure. What actions can I take to prevent diabetes?      Be physically active. ? Do moderate-intensity physical activity for 30 or more minutes on 5 or more days of the week, or as much as told by your health care provider. This could be brisk walking, biking, or water aerobics. ? Ask your health care provider what activities are safe for you. A mix of physical activities may be best, such as walking, swimming, cycling, and strength  training.  Lose weight as told by your health care provider. ? Losing 5-7% of your body weight can reverse insulin resistance. ?  Your health care provider can determine how much weight loss is best for you and can help you lose weight safely.  Follow a healthy meal plan. This includes eating lean proteins, complex carbohydrates, fresh fruits and vegetables, low-fat dairy products, and healthy fats. ? Follow instructions from your health care provider about eating or drinking restrictions. ? Make an appointment to see a diet and nutrition specialist (registered dietitian) to help you create a healthy eating plan that is right for you.  Do not smoke or use any tobacco products, such as cigarettes, chewing tobacco, and e-cigarettes. If you need help quitting, ask your health care provider.  Take over-the-counter and prescription medicines as told by your health care provider. You may be prescribed medicines that help lower the risk of type 2 diabetes.  Keep all follow-up visits as told by your health care provider. This is important. Summary  Prediabetes is the condition of having a blood sugar (blood glucose) level that is higher than it should be, but not high enough for you to be diagnosed with type 2 diabetes.  Having prediabetes puts you at risk for developing type 2 diabetes (type 2 diabetes mellitus).  To help prevent type 2 diabetes, make lifestyle changes such as being physically active and eating a healthy diet. Lose weight as told by your health care provider. This information is not intended to replace advice given to you by your health care provider. Make sure you discuss any questions you have with your health care provider. Document Revised: 05/17/2018 Document Reviewed: 03/16/2015 Elsevier Patient Education  [employer].     If you have lab work done today you will be contacted with your lab results within the next 2 weeks.  If you have not heard from [place] then please  contact us. The fastest way to get your results is to register for My Chart.   IF you received an x-ray today, you will receive an invoice from [facility]. Please contact [facility] at [phone] with questions or concerns regarding your invoice.   IF you received labwork today, you will receive an invoice from [place]. Please contact [facility] at [phone] with questions or concerns regarding your invoice.   Our billing staff will not be able to assist you with questions regarding bills from these companies.  You will be contacted with the lab results as soon as they are available. The fastest way to get your results is to activate your My Chart account. Instructions are located on the last page of this paperwork. If you have not heard from [place] regarding the results in 2 weeks, please contact this office.         Signed, Meredith Staggers, MD [facility]

## 2019-12-26 NOTE — Patient Instructions (Addendum)
Continue same dose of atenolol and amlodipine.  Hydrochlorothiazide should remain at 12.5 mg/day.  You can switch over from the half pill of your current medicine to 1 full pill of the 12.5 mg dose whenever you are ready and that was sent to your pharmacy    Prediabetes Prediabetes is the condition of having a blood sugar (blood glucose) level that is higher than it should be, but not high enough for you to be diagnosed with type 2 diabetes. Having prediabetes puts you at risk for developing type 2 diabetes (type 2 diabetes mellitus). Prediabetes may be called impaired glucose tolerance or impaired fasting glucose. Prediabetes usually does not cause symptoms. Your health care provider can diagnose this condition with blood tests. You may be tested for prediabetes if you are overweight and if you have at least one other risk factor for prediabetes. What is blood glucose, and how is it measured? Blood glucose refers to the amount of glucose in your bloodstream. Glucose comes from eating foods that contain sugars and starches (carbohydrates), which the body breaks down into glucose. Your blood glucose level may be measured in mg/dL (milligrams per deciliter) or mmol/L (millimoles per liter). Your blood glucose may be checked with one or more of the following blood tests:  A fasting blood glucose (FBG) test. You will not be allowed to eat (you will fast) for 8 hours or longer before a blood sample is taken. ? A normal range for FBG is 70-100 mg/dl (9.5-0.9 mmol/L).  An A1c (hemoglobin A1c) blood test. This test provides information about blood glucose control over the previous 2?41months.  An oral glucose tolerance test (OGTT). This test measures your blood glucose at two times: ? After fasting. This is your baseline level. ? Two hours after you drink a beverage that contains glucose. You may be diagnosed with prediabetes:  If your FBG is 100?125 mg/dL (3.2-6.7 mmol/L).  If your A1c level is  5.7?6.4%.  If your OGTT result is 140?199 mg/dL (1.2-45 mmol/L). These blood tests may be repeated to confirm your diagnosis. How can this condition affect me? The pancreas produces a hormone (insulin) that helps to move glucose from the bloodstream into cells. When cells in the body do not respond properly to insulin that the body makes (insulin resistance), excess glucose builds up in the blood instead of going into cells. As a result, high blood glucose (hyperglycemia) can develop, which can cause many complications. Hyperglycemia is a symptom of prediabetes. Having high blood glucose for a long time is dangerous. Too much glucose in your blood can damage your nerves and blood vessels. Long-term damage can lead to complications from diabetes, which may include:  Heart disease.  Stroke.  Blindness.  Kidney disease.  Depression.  Poor circulation in the feet and legs, which could lead to surgical removal (amputation) in severe cases. What can increase my risk? Risk factors for prediabetes include:  Having a family member with type 2 diabetes.  Being overweight or obese.  Being older than age 46.  Being of American [place], African-American, Hispanic/Latino, or Asian/Pacific Islander descent.  Having an inactive (sedentary) lifestyle.  Having a history of heart disease.  History of gestational diabetes or polycystic ovary syndrome (PCOS), in women.  Having low levels of good cholesterol (HDL-C) or high levels of blood fats (triglycerides).  Having high blood pressure. What actions can I take to prevent diabetes?      Be physically active. ? Do moderate-intensity physical activity for 30 or more  minutes on 5 or more days of the week, or as much as told by your health care provider. This could be brisk walking, biking, or water aerobics. ? Ask your health care provider what activities are safe for you. A mix of physical activities may be best, such as walking, swimming,  cycling, and strength training.  Lose weight as told by your health care provider. ? Losing 5-7% of your body weight can reverse insulin resistance. ? Your health care provider can determine how much weight loss is best for you and can help you lose weight safely.  Follow a healthy meal plan. This includes eating lean proteins, complex carbohydrates, fresh fruits and vegetables, low-fat dairy products, and healthy fats. ? Follow instructions from your health care provider about eating or drinking restrictions. ? Make an appointment to see a diet and nutrition specialist (registered dietitian) to help you create a healthy eating plan that is right for you.  Do not smoke or use any tobacco products, such as cigarettes, chewing tobacco, and e-cigarettes. If you need help quitting, ask your health care provider.  Take over-the-counter and prescription medicines as told by your health care provider. You may be prescribed medicines that help lower the risk of type 2 diabetes.  Keep all follow-up visits as told by your health care provider. This is important. Summary  Prediabetes is the condition of having a blood sugar (blood glucose) level that is higher than it should be, but not high enough for you to be diagnosed with type 2 diabetes.  Having prediabetes puts you at risk for developing type 2 diabetes (type 2 diabetes mellitus).  To help prevent type 2 diabetes, make lifestyle changes such as being physically active and eating a healthy diet. Lose weight as told by your health care provider. This information is not intended to replace advice given to you by your health care provider. Make sure you discuss any questions you have with your health care provider. Document Revised: 05/17/2018 Document Reviewed: 03/16/2015 Elsevier Patient Education  [employer].     If you have lab work done today you will be contacted with your lab results within the next 2 weeks.  If you have not heard  from [place] then please contact us. The fastest way to get your results is to register for My Chart.   IF you received an x-ray today, you will receive an invoice from [facility]. Please contact [facility] at [phone] with questions or concerns regarding your invoice.   IF you received labwork today, you will receive an invoice from [facility] [facility]. Please contact [facility] at [phone] with questions or concerns regarding your invoice.   Our billing staff will not be able to assist you with questions regarding bills from these companies.  You will be contacted with the lab results as soon as they are available. The fastest way to get your results is to activate your My Chart account. Instructions are located on the last page of this paperwork. If you have not heard from [place] regarding the results in 2 weeks, please contact this office.

## 2019-12-27 LAB — BASIC METABOLIC PANEL
BUN/Creatinine Ratio: 11 (ref 9–20)
BUN: 10 mg/dL (ref 6–24)
CO2: 28 mmol/L (ref 20–29)
Calcium: 9.8 mg/dL (ref 8.7–10.2)
Chloride: 101 mmol/L (ref 96–106)
Creatinine, Ser: 0.87 mg/dL (ref 0.76–1.27)
GFR calc Af Amer: 120 mL/min/{1.73_m2} (ref >59)
GFR calc non Af Amer: 103 mL/min/{1.73_m2} (ref >59)
Glucose: 80 mg/dL (ref 65–99)
Potassium: 3.6 mmol/L (ref 3.5–5.2)
Sodium: 142 mmol/L (ref 134–144)

## 2019-12-27 LAB — CBC
Hematocrit: 49.9 % (ref 37.5–51.0)
Hemoglobin: 17.5 g/dL (ref 13.0–17.7)
MCH: 30.5 pg (ref 26.6–33.0)
MCHC: 35.1 g/dL (ref 31.5–35.7)
MCV: 87 fL (ref 79–97)
Platelets: 325 10*3/uL (ref 150–450)
RBC: 5.74 x10E6/uL (ref 4.14–5.80)
RDW: 12.3 % (ref 11.6–15.4)
WBC: 5.9 10*3/uL (ref 3.4–10.8)

## 2019-12-27 LAB — HEMOGLOBIN A1C
Est. average glucose Bld gHb Est-mCnc: 114 mg/dL
Hgb A1c MFr Bld: 5.6 % (ref 4.8–5.6)

## 2020-04-21 ENCOUNTER — Telehealth: Payer: Self-pay | Admitting: Family Medicine

## 2020-04-21 NOTE — Telephone Encounter (Signed)
Patient completed record release request to have all of his records faxed to his new provider.

## 2020-06-24 ENCOUNTER — Ambulatory Visit: Payer: Self-pay | Admitting: Family Medicine

## 2020-10-08 ENCOUNTER — Other Ambulatory Visit: Payer: Self-pay | Admitting: Family Medicine

## 2020-10-08 DIAGNOSIS — I10 Essential (primary) hypertension: Secondary | ICD-10-CM

## 2020-10-08 DIAGNOSIS — Z0000 Encounter for general adult medical examination without abnormal findings: Secondary | ICD-10-CM

## 2020-10-18 DIAGNOSIS — Z125 Encounter for screening for malignant neoplasm of prostate: Secondary | ICD-10-CM | POA: Diagnosis not present

## 2020-10-18 DIAGNOSIS — Z13220 Encounter for screening for lipoid disorders: Secondary | ICD-10-CM | POA: Diagnosis not present

## 2020-10-18 DIAGNOSIS — Z1211 Encounter for screening for malignant neoplasm of colon: Secondary | ICD-10-CM | POA: Diagnosis not present

## 2020-10-18 DIAGNOSIS — Z1159 Encounter for screening for other viral diseases: Secondary | ICD-10-CM | POA: Diagnosis not present

## 2020-10-18 DIAGNOSIS — Z23 Encounter for immunization: Secondary | ICD-10-CM | POA: Diagnosis not present

## 2020-10-18 DIAGNOSIS — I10 Essential (primary) hypertension: Secondary | ICD-10-CM | POA: Diagnosis not present

## 2020-10-18 DIAGNOSIS — Z862 Personal history of diseases of the blood and blood-forming organs and certain disorders involving the immune mechanism: Secondary | ICD-10-CM | POA: Diagnosis not present

## 2020-10-18 DIAGNOSIS — R7303 Prediabetes: Secondary | ICD-10-CM | POA: Diagnosis not present

## 2020-10-18 DIAGNOSIS — Z0000 Encounter for general adult medical examination without abnormal findings: Secondary | ICD-10-CM | POA: Diagnosis not present

## 2021-04-13 DIAGNOSIS — E786 Lipoprotein deficiency: Secondary | ICD-10-CM | POA: Diagnosis not present

## 2021-04-13 DIAGNOSIS — I10 Essential (primary) hypertension: Secondary | ICD-10-CM | POA: Diagnosis not present

## 2021-04-13 DIAGNOSIS — R7303 Prediabetes: Secondary | ICD-10-CM | POA: Diagnosis not present

## 2021-10-28 DIAGNOSIS — Z0000 Encounter for general adult medical examination without abnormal findings: Secondary | ICD-10-CM | POA: Diagnosis not present

## 2021-10-28 DIAGNOSIS — E786 Lipoprotein deficiency: Secondary | ICD-10-CM | POA: Diagnosis not present

## 2021-10-28 DIAGNOSIS — I10 Essential (primary) hypertension: Secondary | ICD-10-CM | POA: Diagnosis not present

## 2021-10-28 DIAGNOSIS — Z1211 Encounter for screening for malignant neoplasm of colon: Secondary | ICD-10-CM | POA: Diagnosis not present

## 2021-10-28 DIAGNOSIS — R7303 Prediabetes: Secondary | ICD-10-CM | POA: Diagnosis not present

## 2021-10-28 DIAGNOSIS — Z13220 Encounter for screening for lipoid disorders: Secondary | ICD-10-CM | POA: Diagnosis not present

## 2022-01-04 DIAGNOSIS — I10 Essential (primary) hypertension: Secondary | ICD-10-CM | POA: Diagnosis not present

## 2022-11-09 DIAGNOSIS — I10 Essential (primary) hypertension: Secondary | ICD-10-CM | POA: Diagnosis not present

## 2022-11-09 DIAGNOSIS — Z125 Encounter for screening for malignant neoplasm of prostate: Secondary | ICD-10-CM | POA: Diagnosis not present

## 2022-11-09 DIAGNOSIS — Z23 Encounter for immunization: Secondary | ICD-10-CM | POA: Diagnosis not present

## 2022-11-09 DIAGNOSIS — R7303 Prediabetes: Secondary | ICD-10-CM | POA: Diagnosis not present

## 2022-11-09 DIAGNOSIS — Z0000 Encounter for general adult medical examination without abnormal findings: Secondary | ICD-10-CM | POA: Diagnosis not present

## 2022-11-09 DIAGNOSIS — Z13220 Encounter for screening for lipoid disorders: Secondary | ICD-10-CM | POA: Diagnosis not present

## 2022-11-13 DIAGNOSIS — Z1211 Encounter for screening for malignant neoplasm of colon: Secondary | ICD-10-CM | POA: Diagnosis not present

## 2022-12-26 DIAGNOSIS — E876 Hypokalemia: Secondary | ICD-10-CM | POA: Diagnosis not present

## 2023-01-12 DIAGNOSIS — K625 Hemorrhage of anus and rectum: Secondary | ICD-10-CM | POA: Diagnosis not present

## 2023-01-12 DIAGNOSIS — K648 Other hemorrhoids: Secondary | ICD-10-CM | POA: Diagnosis not present

## 2023-01-29 DIAGNOSIS — E876 Hypokalemia: Secondary | ICD-10-CM | POA: Diagnosis not present

## 2023-01-30 DIAGNOSIS — K625 Hemorrhage of anus and rectum: Secondary | ICD-10-CM | POA: Diagnosis not present

## 2023-02-13 DIAGNOSIS — R195 Other fecal abnormalities: Secondary | ICD-10-CM | POA: Diagnosis not present

## 2023-03-14 DIAGNOSIS — R195 Other fecal abnormalities: Secondary | ICD-10-CM | POA: Diagnosis not present

## 2023-05-10 DIAGNOSIS — R7303 Prediabetes: Secondary | ICD-10-CM | POA: Diagnosis not present

## 2023-05-10 DIAGNOSIS — K648 Other hemorrhoids: Secondary | ICD-10-CM | POA: Diagnosis not present

## 2023-05-10 DIAGNOSIS — E876 Hypokalemia: Secondary | ICD-10-CM | POA: Diagnosis not present

## 2023-05-10 DIAGNOSIS — I10 Essential (primary) hypertension: Secondary | ICD-10-CM | POA: Diagnosis not present

## 2023-06-15 DIAGNOSIS — E876 Hypokalemia: Secondary | ICD-10-CM | POA: Diagnosis not present

## 2023-07-10 DIAGNOSIS — E876 Hypokalemia: Secondary | ICD-10-CM | POA: Diagnosis not present

## 2023-09-10 DIAGNOSIS — E876 Hypokalemia: Secondary | ICD-10-CM | POA: Diagnosis not present

## 2023-12-12 DIAGNOSIS — Z0184 Encounter for antibody response examination: Secondary | ICD-10-CM | POA: Diagnosis not present

## 2023-12-12 DIAGNOSIS — Z125 Encounter for screening for malignant neoplasm of prostate: Secondary | ICD-10-CM | POA: Diagnosis not present

## 2023-12-12 DIAGNOSIS — Z23 Encounter for immunization: Secondary | ICD-10-CM | POA: Diagnosis not present

## 2023-12-12 DIAGNOSIS — R7303 Prediabetes: Secondary | ICD-10-CM | POA: Diagnosis not present

## 2023-12-12 DIAGNOSIS — I10 Essential (primary) hypertension: Secondary | ICD-10-CM | POA: Diagnosis not present

## 2023-12-12 DIAGNOSIS — Z13220 Encounter for screening for lipoid disorders: Secondary | ICD-10-CM | POA: Diagnosis not present

## 2023-12-12 DIAGNOSIS — Z0000 Encounter for general adult medical examination without abnormal findings: Secondary | ICD-10-CM | POA: Diagnosis not present
